# Patient Record
Sex: Female | Born: 1960 | Race: White | Hispanic: No | Marital: Married | Smoking: Never smoker
Health system: Southern US, Community
[De-identification: ages and names within clinical notes are randomized; demographics above are authoritative.]

## PROBLEM LIST (undated history)

## (undated) DIAGNOSIS — K5792 Diverticulitis of intestine, part unspecified, without perforation or abscess without bleeding: Secondary | ICD-10-CM

## (undated) HISTORY — PX: BREAST EXCISIONAL BIOPSY: SUR124

---

## 1999-09-18 ENCOUNTER — Encounter: Payer: Self-pay | Admitting: *Deleted

## 1999-09-18 ENCOUNTER — Encounter: Admission: RE | Admit: 1999-09-18 | Discharge: 1999-09-18 | Payer: Self-pay

## 1999-09-25 ENCOUNTER — Encounter: Payer: Self-pay | Admitting: *Deleted

## 1999-09-25 ENCOUNTER — Encounter: Admission: RE | Admit: 1999-09-25 | Discharge: 1999-09-25 | Payer: Self-pay | Admitting: *Deleted

## 2000-08-07 ENCOUNTER — Encounter: Admission: RE | Admit: 2000-08-07 | Discharge: 2000-11-05 | Payer: Self-pay | Admitting: Internal Medicine

## 2001-07-16 ENCOUNTER — Encounter: Payer: Self-pay | Admitting: Internal Medicine

## 2001-07-16 ENCOUNTER — Encounter: Admission: RE | Admit: 2001-07-16 | Discharge: 2001-07-16 | Payer: Self-pay | Admitting: Internal Medicine

## 2002-06-19 ENCOUNTER — Encounter: Payer: Self-pay | Admitting: Internal Medicine

## 2002-06-19 ENCOUNTER — Encounter: Admission: RE | Admit: 2002-06-19 | Discharge: 2002-06-19 | Payer: Self-pay | Admitting: Internal Medicine

## 2002-07-31 ENCOUNTER — Encounter: Admission: RE | Admit: 2002-07-31 | Discharge: 2002-07-31 | Payer: Self-pay | Admitting: Internal Medicine

## 2002-07-31 ENCOUNTER — Encounter: Payer: Self-pay | Admitting: Internal Medicine

## 2002-08-04 ENCOUNTER — Encounter: Payer: Self-pay | Admitting: Internal Medicine

## 2002-08-04 ENCOUNTER — Encounter: Admission: RE | Admit: 2002-08-04 | Discharge: 2002-08-04 | Payer: Self-pay | Admitting: Internal Medicine

## 2002-08-13 ENCOUNTER — Encounter: Payer: Self-pay | Admitting: Internal Medicine

## 2002-08-13 ENCOUNTER — Encounter: Admission: RE | Admit: 2002-08-13 | Discharge: 2002-08-13 | Payer: Self-pay | Admitting: Internal Medicine

## 2002-09-01 ENCOUNTER — Encounter: Admission: RE | Admit: 2002-09-01 | Discharge: 2002-09-01 | Payer: Self-pay | Admitting: Internal Medicine

## 2002-09-01 ENCOUNTER — Encounter: Payer: Self-pay | Admitting: Internal Medicine

## 2003-02-11 ENCOUNTER — Ambulatory Visit (HOSPITAL_COMMUNITY): Admission: RE | Admit: 2003-02-11 | Discharge: 2003-02-11 | Payer: Self-pay | Admitting: Gastroenterology

## 2003-02-11 ENCOUNTER — Encounter (INDEPENDENT_AMBULATORY_CARE_PROVIDER_SITE_OTHER): Payer: Self-pay

## 2003-05-13 ENCOUNTER — Encounter: Admission: RE | Admit: 2003-05-13 | Discharge: 2003-05-13 | Payer: Self-pay | Admitting: Internal Medicine

## 2004-02-14 ENCOUNTER — Encounter: Admission: RE | Admit: 2004-02-14 | Discharge: 2004-02-14 | Payer: Self-pay | Admitting: Internal Medicine

## 2004-07-18 ENCOUNTER — Encounter: Admission: RE | Admit: 2004-07-18 | Discharge: 2004-07-18 | Payer: Self-pay | Admitting: Internal Medicine

## 2004-08-17 ENCOUNTER — Encounter: Admission: RE | Admit: 2004-08-17 | Discharge: 2004-08-17 | Payer: Self-pay | Admitting: Internal Medicine

## 2004-09-28 ENCOUNTER — Encounter: Admission: RE | Admit: 2004-09-28 | Discharge: 2004-09-28 | Payer: Self-pay | Admitting: Internal Medicine

## 2004-11-18 ENCOUNTER — Emergency Department (HOSPITAL_COMMUNITY): Admission: EM | Admit: 2004-11-18 | Discharge: 2004-11-18 | Payer: Self-pay | Admitting: Emergency Medicine

## 2005-04-04 ENCOUNTER — Encounter: Admission: RE | Admit: 2005-04-04 | Discharge: 2005-04-04 | Payer: Self-pay | Admitting: Internal Medicine

## 2005-05-01 ENCOUNTER — Ambulatory Visit: Payer: Self-pay | Admitting: Gastroenterology

## 2005-06-05 ENCOUNTER — Encounter: Admission: RE | Admit: 2005-06-05 | Discharge: 2005-06-05 | Payer: Self-pay | Admitting: Internal Medicine

## 2005-08-06 ENCOUNTER — Encounter: Admission: RE | Admit: 2005-08-06 | Discharge: 2005-08-06 | Payer: Self-pay | Admitting: Internal Medicine

## 2005-08-16 ENCOUNTER — Encounter: Admission: RE | Admit: 2005-08-16 | Discharge: 2005-08-16 | Payer: Self-pay | Admitting: Internal Medicine

## 2005-12-03 ENCOUNTER — Other Ambulatory Visit: Admission: RE | Admit: 2005-12-03 | Discharge: 2005-12-03 | Payer: Self-pay | Admitting: Internal Medicine

## 2006-09-20 ENCOUNTER — Encounter: Admission: RE | Admit: 2006-09-20 | Discharge: 2006-09-20 | Payer: Self-pay | Admitting: Internal Medicine

## 2007-10-10 ENCOUNTER — Emergency Department (HOSPITAL_COMMUNITY): Admission: EM | Admit: 2007-10-10 | Discharge: 2007-10-10 | Payer: Self-pay | Admitting: Emergency Medicine

## 2007-10-27 ENCOUNTER — Encounter: Admission: RE | Admit: 2007-10-27 | Discharge: 2007-10-27 | Payer: Self-pay | Admitting: Family Medicine

## 2008-10-28 ENCOUNTER — Other Ambulatory Visit: Admission: RE | Admit: 2008-10-28 | Discharge: 2008-10-28 | Payer: Self-pay | Admitting: Internal Medicine

## 2008-10-28 ENCOUNTER — Encounter: Admission: RE | Admit: 2008-10-28 | Discharge: 2008-10-28 | Payer: Self-pay | Admitting: Internal Medicine

## 2009-11-18 ENCOUNTER — Encounter: Admission: RE | Admit: 2009-11-18 | Discharge: 2009-11-18 | Payer: Self-pay | Admitting: Internal Medicine

## 2010-07-23 ENCOUNTER — Encounter: Payer: Self-pay | Admitting: Internal Medicine

## 2010-11-17 NOTE — Op Note (Signed)
   NAME:  Carol Newman, Carol Newman                            ACCOUNT NO.:  [id number]   MEDICAL RECORD NO.:  [id number]                   Newman TYPE:  AMB   LOCATION:  ENDO                                 FACILITY:  [facility]   PHYSICIAN:  Danise Edge, M.D.                DATE OF BIRTH:  11-Mar-1961   DATE OF PROCEDURE:  02/11/2003  DATE OF DISCHARGE:                                 OPERATIVE REPORT   PROCEDURE:  Colonoscopy.   PROCEDURE INDICATIONS:  Carol Newman is a 50 year old female born January 24, 1961.  Carol Newman is undergoing a diagnostic colonoscopy following a bout of  diverticulitis.   ENDOSCOPIST:  Danise Edge, M.D.   PREMEDICATION:  Versed 7.5 mg, Demerol 50 mg.   DESCRIPTION OF PROCEDURE:  After obtaining informed consent, Carol Newman was  placed in Carol left lateral decubitus position.  I administered intravenous  Demerol and intravenous Versed to achieve conscious sedation for Carol  procedure.  Carol Newman's blood pressure, oxygen saturation, and cardiac  rhythm were monitored throughout Carol procedure and documented in Carol medical  record.   Anal inspection was normal.  Digital rectal exam was normal.  Carol Olympus  adjustable pediatric colonoscope was introduced into Carol rectum and easily  advanced to Carol cecum.  Colonic preparation for Carol exam today was  excellent.   Rectum:  From Carol midrectum a 1 mm sessile polyp was removed with Carol cold  biopsy forceps.   Sigmoid colon and descending colon normal.   Splenic flexure normal.   Transverse colon normal.   Hepatic flexure normal.   Ascending colon normal.   Cecum and ileocecal valve normal.    ASSESSMENT:  From Carol midrectum a diminutive polyp was removed with Carol cold  biopsy forceps; otherwise normal proctocolonoscopy to Carol cecum.  No  endoscopic evidence for Carol presence of colonic diverticulosis,  diverticulitis, diverticular stricture formation, colorectal neoplasia, or  inflammatory bowel  disease.                                               Danise Edge, M.D.    MJ/MEDQ  D:  02/11/2003  T:  02/11/2003  Job:  [id number]   cc:   Georgann Housekeeper, M.D.  [address]  [place]  [place] [address]  Fax: [phone]

## 2010-11-28 ENCOUNTER — Ambulatory Visit
Admission: RE | Admit: 2010-11-28 | Discharge: 2010-11-28 | Disposition: A | Discharge status: Home / Self Care | Payer: BC Managed Care – PPO | Source: Ambulatory Visit | Attending: Internal Medicine | Admitting: Internal Medicine

## 2010-11-28 ENCOUNTER — Other Ambulatory Visit: Payer: Self-pay | Admitting: Internal Medicine

## 2010-11-28 DIAGNOSIS — Z1231 Encounter for screening mammogram for malignant neoplasm of breast: Secondary | ICD-10-CM

## 2011-03-27 LAB — URINALYSIS, ROUTINE W REFLEX MICROSCOPIC
Glucose, UA: NEGATIVE
Hgb urine dipstick: NEGATIVE
Specific Gravity, Urine: 1.003 — ABNORMAL LOW
pH: 7

## 2011-03-27 LAB — CBC
HCT: 39.7
Hemoglobin: 13.1
WBC: 7.8

## 2011-03-27 LAB — DIFFERENTIAL
Basophils Absolute: 0
Basophils Relative: 0
Eosinophils Absolute: 0.1
Monocytes Absolute: 0.5
Neutro Abs: 4.7
Neutrophils Relative %: 60

## 2011-03-27 LAB — COMPREHENSIVE METABOLIC PANEL
ALT: 59 — ABNORMAL HIGH
Albumin: 4.1
Alkaline Phosphatase: 61
BUN: 11
Chloride: 108
Glucose, Bld: 108 — ABNORMAL HIGH
Potassium: 4.7
Sodium: 141
Total Bilirubin: 1.3 — ABNORMAL HIGH

## 2011-03-27 LAB — B-NATRIURETIC PEPTIDE (CONVERTED LAB): Pro B Natriuretic peptide (BNP): <30

## 2011-03-27 LAB — D-DIMER, QUANTITATIVE: D-Dimer, Quant: <0.22

## 2011-10-16 ENCOUNTER — Other Ambulatory Visit: Payer: Self-pay | Admitting: Internal Medicine

## 2011-10-16 ENCOUNTER — Other Ambulatory Visit (HOSPITAL_COMMUNITY)
Admission: RE | Admit: 2011-10-16 | Discharge: 2011-10-16 | Disposition: A | Discharge status: Home / Self Care | Payer: BC Managed Care – PPO | Source: Ambulatory Visit | Attending: Internal Medicine | Admitting: Internal Medicine

## 2011-10-16 DIAGNOSIS — Z1159 Encounter for screening for other viral diseases: Secondary | ICD-10-CM | POA: Insufficient documentation

## 2011-10-16 DIAGNOSIS — Z01419 Encounter for gynecological examination (general) (routine) without abnormal findings: Secondary | ICD-10-CM | POA: Insufficient documentation

## 2011-11-07 ENCOUNTER — Ambulatory Visit
Admission: RE | Admit: 2011-11-07 | Discharge: 2011-11-07 | Disposition: A | Discharge status: Home / Self Care | Payer: BC Managed Care – PPO | Source: Ambulatory Visit | Attending: Internal Medicine | Admitting: Internal Medicine

## 2011-11-07 ENCOUNTER — Other Ambulatory Visit: Payer: Self-pay | Admitting: Internal Medicine

## 2011-11-07 DIAGNOSIS — Z1231 Encounter for screening mammogram for malignant neoplasm of breast: Secondary | ICD-10-CM

## 2011-11-20 ENCOUNTER — Other Ambulatory Visit: Payer: Self-pay | Admitting: *Deleted

## 2011-11-21 ENCOUNTER — Other Ambulatory Visit: Payer: BC Managed Care – PPO

## 2011-11-21 ENCOUNTER — Ambulatory Visit
Admission: RE | Admit: 2011-11-21 | Discharge: 2011-11-21 | Disposition: A | Discharge status: Home / Self Care | Payer: BC Managed Care – PPO | Source: Ambulatory Visit | Attending: *Deleted | Admitting: *Deleted

## 2012-01-08 ENCOUNTER — Other Ambulatory Visit: Payer: Self-pay | Admitting: *Deleted

## 2012-01-08 DIAGNOSIS — M542 Cervicalgia: Secondary | ICD-10-CM

## 2012-01-11 ENCOUNTER — Other Ambulatory Visit: Payer: BC Managed Care – PPO

## 2012-01-15 ENCOUNTER — Ambulatory Visit
Admission: RE | Admit: 2012-01-15 | Discharge: 2012-01-15 | Disposition: A | Discharge status: Home / Self Care | Payer: BC Managed Care – PPO | Source: Ambulatory Visit | Attending: *Deleted | Admitting: *Deleted

## 2012-01-15 DIAGNOSIS — M542 Cervicalgia: Secondary | ICD-10-CM

## 2012-02-13 ENCOUNTER — Other Ambulatory Visit: Payer: Self-pay | Admitting: Neurosurgery

## 2012-02-13 DIAGNOSIS — M542 Cervicalgia: Secondary | ICD-10-CM

## 2012-02-19 ENCOUNTER — Ambulatory Visit
Admission: RE | Admit: 2012-02-19 | Discharge: 2012-02-19 | Disposition: A | Discharge status: Home / Self Care | Payer: BC Managed Care – PPO | Source: Ambulatory Visit | Attending: Neurosurgery | Admitting: Neurosurgery

## 2012-02-19 VITALS — BP 107/73 | HR 70 | Wt 186.0 lb

## 2012-02-19 DIAGNOSIS — M542 Cervicalgia: Secondary | ICD-10-CM

## 2012-02-19 MED ORDER — DIAZEPAM 5 MG PO TABS
10.0000 mg | ORAL_TABLET | Freq: Once | ORAL | Status: AC
  Administered 2012-02-19: 10 mg via ORAL

## 2012-02-19 MED ORDER — ONDANSETRON HCL 4 MG/2ML IJ SOLN
4.0000 mg | Freq: Once | INTRAMUSCULAR | Status: AC
  Administered 2012-02-19: 4 mg via INTRAMUSCULAR

## 2012-02-19 MED ORDER — HYDROMORPHONE HCL PF 2 MG/ML IJ SOLN
2.0000 mg | Freq: Once | INTRAMUSCULAR | Status: AC
  Administered 2012-02-19: 2 mg via INTRAMUSCULAR

## 2012-02-19 MED ORDER — IOHEXOL 300 MG/ML  SOLN
10.0000 mL | Freq: Once | INTRAMUSCULAR | Status: AC | PRN
  Administered 2012-02-19: 10 mL via INTRATHECAL

## 2012-11-04 ENCOUNTER — Other Ambulatory Visit: Payer: Self-pay

## 2012-11-04 DIAGNOSIS — Z1231 Encounter for screening mammogram for malignant neoplasm of breast: Secondary | ICD-10-CM

## 2012-12-10 ENCOUNTER — Ambulatory Visit
Admission: RE | Admit: 2012-12-10 | Discharge: 2012-12-10 | Disposition: A | Discharge status: Home / Self Care | Payer: BC Managed Care – PPO | Source: Ambulatory Visit

## 2012-12-10 DIAGNOSIS — Z1231 Encounter for screening mammogram for malignant neoplasm of breast: Secondary | ICD-10-CM

## 2013-08-05 ENCOUNTER — Ambulatory Visit
Admission: RE | Admit: 2013-08-05 | Discharge: 2013-08-05 | Disposition: A | Discharge status: Home / Self Care | Payer: BC Managed Care – PPO | Source: Ambulatory Visit | Attending: Internal Medicine | Admitting: Internal Medicine

## 2013-08-05 ENCOUNTER — Other Ambulatory Visit: Payer: Self-pay | Admitting: Internal Medicine

## 2013-08-05 DIAGNOSIS — M545 Low back pain, unspecified: Secondary | ICD-10-CM

## 2013-12-29 ENCOUNTER — Other Ambulatory Visit: Payer: Self-pay

## 2013-12-29 DIAGNOSIS — Z9889 Other specified postprocedural states: Secondary | ICD-10-CM

## 2013-12-29 DIAGNOSIS — Z1231 Encounter for screening mammogram for malignant neoplasm of breast: Secondary | ICD-10-CM

## 2013-12-30 ENCOUNTER — Ambulatory Visit
Admission: RE | Admit: 2013-12-30 | Discharge: 2013-12-30 | Disposition: A | Discharge status: Home / Self Care | Payer: BC Managed Care – PPO | Source: Ambulatory Visit

## 2013-12-30 ENCOUNTER — Other Ambulatory Visit: Payer: Self-pay

## 2013-12-30 ENCOUNTER — Ambulatory Visit: Payer: BC Managed Care – PPO

## 2013-12-30 ENCOUNTER — Encounter (INDEPENDENT_AMBULATORY_CARE_PROVIDER_SITE_OTHER): Payer: Self-pay

## 2013-12-30 DIAGNOSIS — Z9889 Other specified postprocedural states: Secondary | ICD-10-CM

## 2013-12-30 DIAGNOSIS — Z1231 Encounter for screening mammogram for malignant neoplasm of breast: Secondary | ICD-10-CM

## 2013-12-31 ENCOUNTER — Other Ambulatory Visit: Payer: Self-pay | Admitting: Internal Medicine

## 2013-12-31 DIAGNOSIS — R928 Other abnormal and inconclusive findings on diagnostic imaging of breast: Secondary | ICD-10-CM

## 2014-01-12 ENCOUNTER — Ambulatory Visit
Admission: RE | Admit: 2014-01-12 | Discharge: 2014-01-12 | Disposition: A | Discharge status: Home / Self Care | Payer: BC Managed Care – PPO | Source: Ambulatory Visit | Attending: Internal Medicine | Admitting: Internal Medicine

## 2014-01-12 DIAGNOSIS — R928 Other abnormal and inconclusive findings on diagnostic imaging of breast: Secondary | ICD-10-CM

## 2015-02-14 ENCOUNTER — Other Ambulatory Visit: Payer: Self-pay

## 2015-02-14 DIAGNOSIS — Z1231 Encounter for screening mammogram for malignant neoplasm of breast: Secondary | ICD-10-CM

## 2015-02-23 ENCOUNTER — Ambulatory Visit
Admission: RE | Admit: 2015-02-23 | Discharge: 2015-02-23 | Disposition: A | Discharge status: Home / Self Care | Payer: BLUE CROSS/BLUE SHIELD | Source: Ambulatory Visit

## 2015-02-23 DIAGNOSIS — Z1231 Encounter for screening mammogram for malignant neoplasm of breast: Secondary | ICD-10-CM

## 2015-02-24 ENCOUNTER — Other Ambulatory Visit: Payer: Self-pay | Admitting: Internal Medicine

## 2015-02-24 DIAGNOSIS — R928 Other abnormal and inconclusive findings on diagnostic imaging of breast: Secondary | ICD-10-CM

## 2015-03-17 ENCOUNTER — Ambulatory Visit
Admission: RE | Admit: 2015-03-17 | Discharge: 2015-03-17 | Disposition: A | Discharge status: Home / Self Care | Payer: BLUE CROSS/BLUE SHIELD | Source: Ambulatory Visit | Attending: Internal Medicine | Admitting: Internal Medicine

## 2015-03-17 DIAGNOSIS — R928 Other abnormal and inconclusive findings on diagnostic imaging of breast: Secondary | ICD-10-CM

## 2015-03-29 ENCOUNTER — Other Ambulatory Visit: Payer: Self-pay | Admitting: Obstetrics & Gynecology

## 2015-05-25 ENCOUNTER — Other Ambulatory Visit: Payer: Self-pay | Admitting: Internal Medicine

## 2015-05-25 ENCOUNTER — Ambulatory Visit
Admission: RE | Admit: 2015-05-25 | Discharge: 2015-05-25 | Disposition: A | Discharge status: Home / Self Care | Payer: BLUE CROSS/BLUE SHIELD | Source: Ambulatory Visit | Attending: Internal Medicine | Admitting: Internal Medicine

## 2015-05-25 DIAGNOSIS — Z87891 Personal history of nicotine dependence: Secondary | ICD-10-CM

## 2015-07-06 ENCOUNTER — Other Ambulatory Visit: Payer: Self-pay | Admitting: Internal Medicine

## 2015-07-06 DIAGNOSIS — R921 Mammographic calcification found on diagnostic imaging of breast: Secondary | ICD-10-CM

## 2015-09-21 ENCOUNTER — Other Ambulatory Visit: Payer: Self-pay | Admitting: Internal Medicine

## 2015-09-21 ENCOUNTER — Ambulatory Visit
Admission: RE | Admit: 2015-09-21 | Discharge: 2015-09-21 | Disposition: A | Discharge status: Home / Self Care | Payer: BLUE CROSS/BLUE SHIELD | Source: Ambulatory Visit | Attending: Internal Medicine | Admitting: Internal Medicine

## 2015-09-21 DIAGNOSIS — R921 Mammographic calcification found on diagnostic imaging of breast: Secondary | ICD-10-CM

## 2015-11-17 DIAGNOSIS — M545 Low back pain: Secondary | ICD-10-CM | POA: Diagnosis not present

## 2015-11-17 DIAGNOSIS — M797 Fibromyalgia: Secondary | ICD-10-CM | POA: Diagnosis not present

## 2015-11-17 DIAGNOSIS — N9089 Other specified noninflammatory disorders of vulva and perineum: Secondary | ICD-10-CM | POA: Diagnosis not present

## 2015-11-17 DIAGNOSIS — N949 Unspecified condition associated with female genital organs and menstrual cycle: Secondary | ICD-10-CM | POA: Diagnosis not present

## 2015-12-15 DIAGNOSIS — J019 Acute sinusitis, unspecified: Secondary | ICD-10-CM | POA: Diagnosis not present

## 2015-12-15 DIAGNOSIS — M797 Fibromyalgia: Secondary | ICD-10-CM | POA: Diagnosis not present

## 2015-12-15 DIAGNOSIS — M25659 Stiffness of unspecified hip, not elsewhere classified: Secondary | ICD-10-CM | POA: Diagnosis not present

## 2016-02-15 DIAGNOSIS — E559 Vitamin D deficiency, unspecified: Secondary | ICD-10-CM | POA: Diagnosis not present

## 2016-02-15 DIAGNOSIS — Z79899 Other long term (current) drug therapy: Secondary | ICD-10-CM | POA: Diagnosis not present

## 2016-02-15 DIAGNOSIS — M545 Low back pain: Secondary | ICD-10-CM | POA: Diagnosis not present

## 2016-02-15 DIAGNOSIS — E7800 Pure hypercholesterolemia, unspecified: Secondary | ICD-10-CM | POA: Diagnosis not present

## 2016-02-15 DIAGNOSIS — R1032 Left lower quadrant pain: Secondary | ICD-10-CM | POA: Diagnosis not present

## 2016-02-15 DIAGNOSIS — M797 Fibromyalgia: Secondary | ICD-10-CM | POA: Diagnosis not present

## 2016-02-15 DIAGNOSIS — N949 Unspecified condition associated with female genital organs and menstrual cycle: Secondary | ICD-10-CM | POA: Diagnosis not present

## 2016-02-22 DIAGNOSIS — R1032 Left lower quadrant pain: Secondary | ICD-10-CM | POA: Diagnosis not present

## 2016-03-12 ENCOUNTER — Other Ambulatory Visit: Payer: Self-pay | Admitting: Internal Medicine

## 2016-03-12 DIAGNOSIS — N63 Unspecified lump in unspecified breast: Secondary | ICD-10-CM

## 2016-03-12 DIAGNOSIS — R921 Mammographic calcification found on diagnostic imaging of breast: Secondary | ICD-10-CM

## 2016-03-16 ENCOUNTER — Ambulatory Visit
Admission: RE | Admit: 2016-03-16 | Discharge: 2016-03-16 | Disposition: A | Discharge status: Home / Self Care | Payer: BLUE CROSS/BLUE SHIELD | Source: Ambulatory Visit | Attending: Internal Medicine | Admitting: Internal Medicine

## 2016-03-16 DIAGNOSIS — R921 Mammographic calcification found on diagnostic imaging of breast: Secondary | ICD-10-CM | POA: Diagnosis not present

## 2016-03-19 DIAGNOSIS — L820 Inflamed seborrheic keratosis: Secondary | ICD-10-CM | POA: Diagnosis not present

## 2016-03-19 DIAGNOSIS — D224 Melanocytic nevi of scalp and neck: Secondary | ICD-10-CM | POA: Diagnosis not present

## 2016-03-19 DIAGNOSIS — D485 Neoplasm of uncertain behavior of skin: Secondary | ICD-10-CM | POA: Diagnosis not present

## 2016-03-24 DIAGNOSIS — Z23 Encounter for immunization: Secondary | ICD-10-CM | POA: Diagnosis not present

## 2016-04-02 DIAGNOSIS — R197 Diarrhea, unspecified: Secondary | ICD-10-CM | POA: Diagnosis not present

## 2016-04-02 DIAGNOSIS — R1032 Left lower quadrant pain: Secondary | ICD-10-CM | POA: Diagnosis not present

## 2016-05-09 ENCOUNTER — Other Ambulatory Visit: Payer: Self-pay | Admitting: Internal Medicine

## 2016-05-09 DIAGNOSIS — E559 Vitamin D deficiency, unspecified: Secondary | ICD-10-CM | POA: Diagnosis not present

## 2016-05-09 DIAGNOSIS — M545 Low back pain: Secondary | ICD-10-CM | POA: Diagnosis not present

## 2016-05-09 DIAGNOSIS — Z0001 Encounter for general adult medical examination with abnormal findings: Secondary | ICD-10-CM | POA: Diagnosis not present

## 2016-05-09 DIAGNOSIS — E7800 Pure hypercholesterolemia, unspecified: Secondary | ICD-10-CM | POA: Diagnosis not present

## 2016-05-09 DIAGNOSIS — R1032 Left lower quadrant pain: Secondary | ICD-10-CM

## 2016-05-09 DIAGNOSIS — R5382 Chronic fatigue, unspecified: Secondary | ICD-10-CM | POA: Diagnosis not present

## 2016-05-15 ENCOUNTER — Ambulatory Visit
Admission: RE | Admit: 2016-05-15 | Discharge: 2016-05-15 | Disposition: A | Discharge status: Home / Self Care | Payer: BLUE CROSS/BLUE SHIELD | Source: Ambulatory Visit | Attending: Internal Medicine | Admitting: Internal Medicine

## 2016-05-15 DIAGNOSIS — R1032 Left lower quadrant pain: Secondary | ICD-10-CM

## 2016-05-15 MED ORDER — IOPAMIDOL (ISOVUE-300) INJECTION 61%
100.0000 mL | Freq: Once | INTRAVENOUS | Status: AC | PRN
  Administered 2016-05-15: 100 mL via INTRAVENOUS

## 2016-06-20 DIAGNOSIS — M85852 Other specified disorders of bone density and structure, left thigh: Secondary | ICD-10-CM | POA: Diagnosis not present

## 2016-06-20 DIAGNOSIS — M8588 Other specified disorders of bone density and structure, other site: Secondary | ICD-10-CM | POA: Diagnosis not present

## 2016-06-20 DIAGNOSIS — M85862 Other specified disorders of bone density and structure, left lower leg: Secondary | ICD-10-CM | POA: Diagnosis not present

## 2016-08-28 DIAGNOSIS — D3132 Benign neoplasm of left choroid: Secondary | ICD-10-CM | POA: Diagnosis not present

## 2016-08-28 DIAGNOSIS — M549 Dorsalgia, unspecified: Secondary | ICD-10-CM | POA: Diagnosis not present

## 2016-10-22 DIAGNOSIS — E538 Deficiency of other specified B group vitamins: Secondary | ICD-10-CM | POA: Diagnosis not present

## 2016-10-22 DIAGNOSIS — E7800 Pure hypercholesterolemia, unspecified: Secondary | ICD-10-CM | POA: Diagnosis not present

## 2016-11-06 DIAGNOSIS — A059 Bacterial foodborne intoxication, unspecified: Secondary | ICD-10-CM | POA: Diagnosis not present

## 2016-11-06 DIAGNOSIS — M545 Low back pain: Secondary | ICD-10-CM | POA: Diagnosis not present

## 2016-12-21 DIAGNOSIS — M545 Low back pain: Secondary | ICD-10-CM | POA: Diagnosis not present

## 2016-12-21 DIAGNOSIS — E7800 Pure hypercholesterolemia, unspecified: Secondary | ICD-10-CM | POA: Diagnosis not present

## 2016-12-21 DIAGNOSIS — E538 Deficiency of other specified B group vitamins: Secondary | ICD-10-CM | POA: Diagnosis not present

## 2017-01-21 DIAGNOSIS — J309 Allergic rhinitis, unspecified: Secondary | ICD-10-CM | POA: Diagnosis not present

## 2017-01-21 DIAGNOSIS — H1033 Unspecified acute conjunctivitis, bilateral: Secondary | ICD-10-CM | POA: Diagnosis not present

## 2017-02-05 ENCOUNTER — Other Ambulatory Visit: Payer: Self-pay | Admitting: Internal Medicine

## 2017-02-05 DIAGNOSIS — R921 Mammographic calcification found on diagnostic imaging of breast: Secondary | ICD-10-CM

## 2017-02-21 DIAGNOSIS — K529 Noninfective gastroenteritis and colitis, unspecified: Secondary | ICD-10-CM | POA: Diagnosis not present

## 2017-02-21 DIAGNOSIS — J029 Acute pharyngitis, unspecified: Secondary | ICD-10-CM | POA: Diagnosis not present

## 2017-02-21 DIAGNOSIS — M25551 Pain in right hip: Secondary | ICD-10-CM | POA: Diagnosis not present

## 2017-03-20 ENCOUNTER — Ambulatory Visit
Admission: RE | Admit: 2017-03-20 | Discharge: 2017-03-20 | Disposition: A | Discharge status: Home / Self Care | Payer: BLUE CROSS/BLUE SHIELD | Source: Ambulatory Visit | Attending: Internal Medicine | Admitting: Internal Medicine

## 2017-03-20 DIAGNOSIS — R921 Mammographic calcification found on diagnostic imaging of breast: Secondary | ICD-10-CM

## 2017-03-20 DIAGNOSIS — R928 Other abnormal and inconclusive findings on diagnostic imaging of breast: Secondary | ICD-10-CM | POA: Diagnosis not present

## 2017-04-02 ENCOUNTER — Other Ambulatory Visit: Payer: Self-pay | Admitting: Nurse Practitioner

## 2017-04-02 ENCOUNTER — Ambulatory Visit
Admission: RE | Admit: 2017-04-02 | Discharge: 2017-04-02 | Disposition: A | Discharge status: Home / Self Care | Payer: BLUE CROSS/BLUE SHIELD | Source: Ambulatory Visit | Attending: Nurse Practitioner | Admitting: Nurse Practitioner

## 2017-04-02 DIAGNOSIS — S93491A Sprain of other ligament of right ankle, initial encounter: Secondary | ICD-10-CM

## 2017-04-02 DIAGNOSIS — S99911A Unspecified injury of right ankle, initial encounter: Secondary | ICD-10-CM | POA: Diagnosis not present

## 2017-04-02 DIAGNOSIS — S93492A Sprain of other ligament of left ankle, initial encounter: Secondary | ICD-10-CM

## 2017-04-02 DIAGNOSIS — M7989 Other specified soft tissue disorders: Secondary | ICD-10-CM | POA: Diagnosis not present

## 2017-04-02 DIAGNOSIS — M25571 Pain in right ankle and joints of right foot: Secondary | ICD-10-CM | POA: Diagnosis not present

## 2017-05-22 DIAGNOSIS — Z79899 Other long term (current) drug therapy: Secondary | ICD-10-CM | POA: Diagnosis not present

## 2017-05-22 DIAGNOSIS — G43009 Migraine without aura, not intractable, without status migrainosus: Secondary | ICD-10-CM | POA: Diagnosis not present

## 2017-05-22 DIAGNOSIS — E559 Vitamin D deficiency, unspecified: Secondary | ICD-10-CM | POA: Diagnosis not present

## 2017-05-22 DIAGNOSIS — E538 Deficiency of other specified B group vitamins: Secondary | ICD-10-CM | POA: Diagnosis not present

## 2017-05-22 DIAGNOSIS — Z0000 Encounter for general adult medical examination without abnormal findings: Secondary | ICD-10-CM | POA: Diagnosis not present

## 2017-05-22 DIAGNOSIS — E7800 Pure hypercholesterolemia, unspecified: Secondary | ICD-10-CM | POA: Diagnosis not present

## 2017-06-20 DIAGNOSIS — D485 Neoplasm of uncertain behavior of skin: Secondary | ICD-10-CM | POA: Diagnosis not present

## 2017-06-20 DIAGNOSIS — L570 Actinic keratosis: Secondary | ICD-10-CM | POA: Diagnosis not present

## 2017-06-20 DIAGNOSIS — L309 Dermatitis, unspecified: Secondary | ICD-10-CM | POA: Diagnosis not present

## 2017-08-12 DIAGNOSIS — R32 Unspecified urinary incontinence: Secondary | ICD-10-CM | POA: Diagnosis not present

## 2017-08-12 DIAGNOSIS — R05 Cough: Secondary | ICD-10-CM | POA: Diagnosis not present

## 2017-08-12 DIAGNOSIS — J209 Acute bronchitis, unspecified: Secondary | ICD-10-CM | POA: Diagnosis not present

## 2017-08-29 DIAGNOSIS — R05 Cough: Secondary | ICD-10-CM | POA: Diagnosis not present

## 2017-08-29 DIAGNOSIS — M545 Low back pain: Secondary | ICD-10-CM | POA: Diagnosis not present

## 2017-08-29 DIAGNOSIS — M797 Fibromyalgia: Secondary | ICD-10-CM | POA: Diagnosis not present

## 2017-09-11 DIAGNOSIS — M549 Dorsalgia, unspecified: Secondary | ICD-10-CM | POA: Diagnosis not present

## 2017-09-11 DIAGNOSIS — J309 Allergic rhinitis, unspecified: Secondary | ICD-10-CM | POA: Diagnosis not present

## 2017-09-11 DIAGNOSIS — J329 Chronic sinusitis, unspecified: Secondary | ICD-10-CM | POA: Diagnosis not present

## 2017-09-11 DIAGNOSIS — E7800 Pure hypercholesterolemia, unspecified: Secondary | ICD-10-CM | POA: Diagnosis not present

## 2017-10-15 DIAGNOSIS — L821 Other seborrheic keratosis: Secondary | ICD-10-CM | POA: Diagnosis not present

## 2017-10-15 DIAGNOSIS — D225 Melanocytic nevi of trunk: Secondary | ICD-10-CM | POA: Diagnosis not present

## 2017-10-15 DIAGNOSIS — L814 Other melanin hyperpigmentation: Secondary | ICD-10-CM | POA: Diagnosis not present

## 2017-10-15 DIAGNOSIS — D1801 Hemangioma of skin and subcutaneous tissue: Secondary | ICD-10-CM | POA: Diagnosis not present

## 2017-11-08 DIAGNOSIS — N390 Urinary tract infection, site not specified: Secondary | ICD-10-CM | POA: Diagnosis not present

## 2017-11-20 DIAGNOSIS — M545 Low back pain: Secondary | ICD-10-CM | POA: Diagnosis not present

## 2017-11-20 DIAGNOSIS — M797 Fibromyalgia: Secondary | ICD-10-CM | POA: Diagnosis not present

## 2017-12-01 DIAGNOSIS — M26601 Right temporomandibular joint disorder, unspecified: Secondary | ICD-10-CM | POA: Diagnosis not present

## 2017-12-04 DIAGNOSIS — J310 Chronic rhinitis: Secondary | ICD-10-CM | POA: Diagnosis not present

## 2017-12-04 DIAGNOSIS — H6123 Impacted cerumen, bilateral: Secondary | ICD-10-CM | POA: Diagnosis not present

## 2018-01-14 DIAGNOSIS — D485 Neoplasm of uncertain behavior of skin: Secondary | ICD-10-CM | POA: Diagnosis not present

## 2018-01-14 DIAGNOSIS — L570 Actinic keratosis: Secondary | ICD-10-CM | POA: Diagnosis not present

## 2018-02-19 DIAGNOSIS — M7070 Other bursitis of hip, unspecified hip: Secondary | ICD-10-CM | POA: Diagnosis not present

## 2018-02-24 DIAGNOSIS — M545 Low back pain: Secondary | ICD-10-CM | POA: Diagnosis not present

## 2018-02-24 DIAGNOSIS — M7070 Other bursitis of hip, unspecified hip: Secondary | ICD-10-CM | POA: Diagnosis not present

## 2018-02-25 ENCOUNTER — Other Ambulatory Visit: Payer: Self-pay | Admitting: Internal Medicine

## 2018-02-25 DIAGNOSIS — Z1231 Encounter for screening mammogram for malignant neoplasm of breast: Secondary | ICD-10-CM

## 2018-03-20 DIAGNOSIS — K602 Anal fissure, unspecified: Secondary | ICD-10-CM | POA: Diagnosis not present

## 2018-03-20 DIAGNOSIS — K6289 Other specified diseases of anus and rectum: Secondary | ICD-10-CM | POA: Diagnosis not present

## 2018-03-21 ENCOUNTER — Ambulatory Visit
Admission: RE | Admit: 2018-03-21 | Discharge: 2018-03-21 | Disposition: A | Discharge status: Home / Self Care | Payer: BLUE CROSS/BLUE SHIELD | Source: Ambulatory Visit | Attending: Internal Medicine | Admitting: Internal Medicine

## 2018-03-21 DIAGNOSIS — Z1231 Encounter for screening mammogram for malignant neoplasm of breast: Secondary | ICD-10-CM

## 2018-06-19 DIAGNOSIS — M545 Low back pain: Secondary | ICD-10-CM | POA: Diagnosis not present

## 2018-09-09 DIAGNOSIS — R21 Rash and other nonspecific skin eruption: Secondary | ICD-10-CM | POA: Diagnosis not present

## 2018-09-22 DIAGNOSIS — N61 Mastitis without abscess: Secondary | ICD-10-CM | POA: Diagnosis not present

## 2018-10-21 DIAGNOSIS — M25659 Stiffness of unspecified hip, not elsewhere classified: Secondary | ICD-10-CM | POA: Diagnosis not present

## 2018-10-21 DIAGNOSIS — M545 Low back pain: Secondary | ICD-10-CM | POA: Diagnosis not present

## 2018-12-18 DIAGNOSIS — M797 Fibromyalgia: Secondary | ICD-10-CM | POA: Diagnosis not present

## 2018-12-18 DIAGNOSIS — J3489 Other specified disorders of nose and nasal sinuses: Secondary | ICD-10-CM | POA: Diagnosis not present

## 2018-12-18 DIAGNOSIS — R509 Fever, unspecified: Secondary | ICD-10-CM | POA: Diagnosis not present

## 2018-12-19 DIAGNOSIS — R509 Fever, unspecified: Secondary | ICD-10-CM | POA: Diagnosis not present

## 2019-01-13 DIAGNOSIS — L570 Actinic keratosis: Secondary | ICD-10-CM | POA: Diagnosis not present

## 2019-01-13 DIAGNOSIS — L821 Other seborrheic keratosis: Secondary | ICD-10-CM | POA: Diagnosis not present

## 2019-01-13 DIAGNOSIS — L814 Other melanin hyperpigmentation: Secondary | ICD-10-CM | POA: Diagnosis not present

## 2019-01-13 DIAGNOSIS — D225 Melanocytic nevi of trunk: Secondary | ICD-10-CM | POA: Diagnosis not present

## 2019-01-13 DIAGNOSIS — Z85828 Personal history of other malignant neoplasm of skin: Secondary | ICD-10-CM | POA: Diagnosis not present

## 2019-02-03 ENCOUNTER — Emergency Department (HOSPITAL_COMMUNITY): Payer: BC Managed Care – PPO

## 2019-02-03 ENCOUNTER — Emergency Department (HOSPITAL_COMMUNITY)
Admission: EM | Admit: 2019-02-03 | Discharge: 2019-02-03 | Disposition: A | Discharge status: Home / Self Care | Payer: BC Managed Care – PPO | Attending: Emergency Medicine | Admitting: Emergency Medicine

## 2019-02-03 ENCOUNTER — Other Ambulatory Visit: Payer: Self-pay

## 2019-02-03 ENCOUNTER — Encounter (HOSPITAL_COMMUNITY): Payer: Self-pay

## 2019-02-03 DIAGNOSIS — Z880 Allergy status to penicillin: Secondary | ICD-10-CM | POA: Diagnosis not present

## 2019-02-03 DIAGNOSIS — R1032 Left lower quadrant pain: Secondary | ICD-10-CM | POA: Diagnosis not present

## 2019-02-03 DIAGNOSIS — K6389 Other specified diseases of intestine: Secondary | ICD-10-CM

## 2019-02-03 DIAGNOSIS — Q438 Other specified congenital malformations of intestine: Secondary | ICD-10-CM | POA: Diagnosis not present

## 2019-02-03 DIAGNOSIS — R109 Unspecified abdominal pain: Secondary | ICD-10-CM | POA: Diagnosis not present

## 2019-02-03 DIAGNOSIS — K659 Peritonitis, unspecified: Secondary | ICD-10-CM | POA: Diagnosis not present

## 2019-02-03 HISTORY — DX: Diverticulitis of intestine, part unspecified, without perforation or abscess without bleeding: K57.92

## 2019-02-03 LAB — CBC WITH DIFFERENTIAL/PLATELET
Abs Immature Granulocytes: 0.02 10*3/uL (ref 0.00–0.07)
Basophils Absolute: 0 10*3/uL (ref 0.0–0.1)
Basophils Relative: 0 %
Eosinophils Absolute: 0.1 10*3/uL (ref 0.0–0.5)
Eosinophils Relative: 1 %
HCT: 42.7 % (ref 36.0–46.0)
Hemoglobin: 13.6 g/dL (ref 12.0–15.0)
Immature Granulocytes: 0 %
Lymphocytes Relative: 27 %
Lymphs Abs: 2 10*3/uL (ref 0.7–4.0)
MCH: 30 pg (ref 26.0–34.0)
MCHC: 31.9 g/dL (ref 30.0–36.0)
MCV: 94.1 fL (ref 80.0–100.0)
Monocytes Absolute: 0.7 10*3/uL (ref 0.1–1.0)
Monocytes Relative: 10 %
Neutro Abs: 4.4 10*3/uL (ref 1.7–7.7)
Neutrophils Relative %: 62 %
Platelets: 289 10*3/uL (ref 150–400)
RBC: 4.54 MIL/uL (ref 3.87–5.11)
RDW: 13.9 % (ref 11.5–15.5)
WBC: 7.2 10*3/uL (ref 4.0–10.5)
nRBC: 0 % (ref 0.0–0.2)

## 2019-02-03 LAB — URINALYSIS, ROUTINE W REFLEX MICROSCOPIC
Bilirubin Urine: NEGATIVE
Glucose, UA: NEGATIVE mg/dL
Ketones, ur: NEGATIVE mg/dL
Leukocytes,Ua: NEGATIVE
Nitrite: NEGATIVE
Protein, ur: NEGATIVE mg/dL
Specific Gravity, Urine: 1.003 — ABNORMAL LOW (ref 1.005–1.030)
pH: 5 (ref 5.0–8.0)

## 2019-02-03 LAB — BASIC METABOLIC PANEL
Anion gap: 7 (ref 5–15)
BUN: 12 mg/dL (ref 6–20)
CO2: 23 mmol/L (ref 22–32)
Calcium: 8.8 mg/dL — ABNORMAL LOW (ref 8.9–10.3)
Chloride: 106 mmol/L (ref 98–111)
Creatinine, Ser: 0.86 mg/dL (ref 0.44–1.00)
GFR calc Af Amer: >60 mL/min (ref >60)
GFR calc non Af Amer: >60 mL/min (ref >60)
Glucose, Bld: 111 mg/dL — ABNORMAL HIGH (ref 70–99)
Potassium: 3.7 mmol/L (ref 3.5–5.1)
Sodium: 136 mmol/L (ref 135–145)

## 2019-02-03 MED ORDER — IOHEXOL 300 MG/ML  SOLN
100.0000 mL | Freq: Once | INTRAMUSCULAR | Status: AC | PRN
  Administered 2019-02-03: 04:00:00 100 mL via INTRAVENOUS

## 2019-02-03 MED ORDER — MORPHINE SULFATE (PF) 4 MG/ML IV SOLN: 4.0000 mg | Freq: Once | INTRAVENOUS | Status: DC

## 2019-02-03 MED ORDER — SODIUM CHLORIDE (PF) 0.9 % IJ SOLN
INTRAMUSCULAR | Status: AC
  Filled 2019-02-03: qty 50

## 2019-02-03 MED ORDER — FAMOTIDINE IN NACL 20-0.9 MG/50ML-% IV SOLN
20.0000 mg | Freq: Once | INTRAVENOUS | Status: AC
  Administered 2019-02-03: 05:00:00 20 mg via INTRAVENOUS
  Filled 2019-02-03: qty 50

## 2019-02-03 MED ORDER — DIPHENHYDRAMINE HCL 50 MG/ML IJ SOLN
25.0000 mg | Freq: Once | INTRAMUSCULAR | Status: AC
  Administered 2019-02-03: 05:00:00 25 mg via INTRAVENOUS
  Filled 2019-02-03: qty 1

## 2019-02-03 MED ORDER — SODIUM CHLORIDE 0.9 % IV BOLUS
1000.0000 mL | Freq: Once | INTRAVENOUS | Status: AC
  Administered 2019-02-03: 1000 mL via INTRAVENOUS

## 2019-02-03 NOTE — ED Triage Notes (Signed)
Pt complains of left lower abdominal pain severely about one hour ago, she has diverticulitis and had her meds refilled but tonight the pain became severe even after 2 hydrocodones

## 2019-02-03 NOTE — ED Notes (Signed)
Pt given Benadryl and Pepcid for reaction to contrast dye with hives and chest tightness. Pt currently not wanting Morphine until Benadryl and Pepcid have had time to work. Pt given call [place] will continue to monitor.

## 2019-02-03 NOTE — Discharge Instructions (Signed)
Continue your antibiotic and pain medication as previously prescribed by your primary doctor.  Return to the emergency department if you develop worsening pain, high fever, bloody stools, or other new and concerning symptoms.

## 2019-02-03 NOTE — ED Notes (Signed)
Patient transported to CT 

## 2019-02-03 NOTE — ED Provider Notes (Signed)
[facility]-EMERGENCY DEPT Provider Note   CSN: [id number] Arrival date & time: 02/03/19  0049     History   Chief Complaint Chief Complaint  Patient presents with  . Abdominal Pain    HPI Carol Newman is a 58 y.o. female.     Patient is a 58 year old female with past medical history of diverticulitis and fibromyalgia.  She presents today for evaluation of left lower quadrant pain.  This has been present for the past 2 days.  She was started on an antibiotic by her primary doctor as well as a pain medicine, however tonight the pain became worse.  She describes her abdomen feeling more distended and rigid.  She also feels as though something "ruptured" in her abdomen.  She denies any fevers or chills.  She denies any bloody stools.  She denies any urinary complaints.  The history is provided by the patient.  Abdominal Pain Pain location:  LLQ Pain quality: cramping   Pain radiates to:  Does not radiate Pain severity:  Moderate Onset quality:  Gradual Duration:  2 days Timing:  Constant Progression:  Worsening Chronicity:  Recurrent Relieved by:  Nothing Worsened by:  Movement and palpation   Past Medical History:  Diagnosis Date  . Diverticulitis     There are no active problems to display for this patient.   History reviewed. No pertinent surgical history.   OB History   No obstetric history on file.      Home Medications    Prior to Admission medications   Not on File    Family History History reviewed. No pertinent family history.  Social History Social History   Tobacco Use  . Smoking status: Never Smoker  . Smokeless tobacco: Never Used  Substance Use Topics  . Alcohol use: Never    Frequency: Never  . Drug use: Never     Allergies   Xylocaine-epinephrine [lidocaine-epinephrine] and Penicillins   Review of Systems Review of Systems  Gastrointestinal: Positive for abdominal pain.  All other systems reviewed and  are negative.    Physical Exam Updated Vital Signs BP (!) 143/84 (BP Location: Left Arm)   Pulse 73   Temp 98 F (36.7 C) (Oral)   Resp 15   Ht 5' 8.5" (1.74 m)   Wt 86.2 kg   SpO2 98%   BMI 28.47 kg/m   Physical Exam Vitals signs and nursing note reviewed.  Constitutional:      General: She is not in acute distress.    Appearance: She is well-developed. She is not diaphoretic.  HENT:     Head: Normocephalic and atraumatic.  Neck:     Musculoskeletal: Normal range of motion and neck supple.  Cardiovascular:     Rate and Rhythm: Normal rate and regular rhythm.     Heart sounds: No murmur. No friction rub. No gallop.   Pulmonary:     Effort: Pulmonary effort is normal. No respiratory distress.     Breath sounds: Normal breath sounds. No wheezing.  Abdominal:     General: Bowel sounds are normal. There is no distension.     Palpations: Abdomen is soft.     Tenderness: There is abdominal tenderness in the left lower quadrant. There is no right CVA tenderness, left CVA tenderness, guarding or rebound.  Musculoskeletal: Normal range of motion.  Skin:    General: Skin is warm and dry.  Neurological:     Mental Status: She is alert and oriented to  person, place, and time.      ED Treatments / Results  Labs (all labs ordered are listed, but only abnormal results are displayed) Labs Reviewed  BASIC METABOLIC PANEL  CBC WITH DIFFERENTIAL/PLATELET  URINALYSIS, ROUTINE W REFLEX MICROSCOPIC    EKG None  Radiology No results found.  Procedures Procedures (including critical care time)  Medications Ordered in ED Medications  sodium chloride 0.9 % bolus 1,000 mL (has no administration in time range)     Initial Impression / Assessment and Plan / ED Course  I have reviewed the triage vital signs and the nursing notes.  Pertinent labs & imaging results that were available during my care of the patient were reviewed by me and considered in my medical decision  making (see chart for details).  Patient with history of diverticulitis presenting with left lower quadrant pain for the past 2 days.  She is on an antibiotic that was prescribed for her by her primary doctor.  Patient states that she felt a "rupture" in this area and is concerned she may have perforated colon.  Patient has no fever and no white count.  Her CT scan shows no evidence for diverticulitis, diverticular abscess, perforation, or other surgical process.  It does show what appears to be epiploic appendagitis.  I will advised the patient to continue her pain medication and antibiotic and follow-up as needed.  Final Clinical Impressions(s) / ED Diagnoses   Final diagnoses:  None    ED Discharge Orders    None       Veryl Speak, MD 02/03/19 [phone]

## 2019-02-16 DIAGNOSIS — E7800 Pure hypercholesterolemia, unspecified: Secondary | ICD-10-CM | POA: Diagnosis not present

## 2019-02-16 DIAGNOSIS — Z0000 Encounter for general adult medical examination without abnormal findings: Secondary | ICD-10-CM | POA: Diagnosis not present

## 2019-02-16 DIAGNOSIS — E559 Vitamin D deficiency, unspecified: Secondary | ICD-10-CM | POA: Diagnosis not present

## 2019-02-16 DIAGNOSIS — M545 Low back pain: Secondary | ICD-10-CM | POA: Diagnosis not present

## 2019-02-16 DIAGNOSIS — M797 Fibromyalgia: Secondary | ICD-10-CM | POA: Diagnosis not present

## 2019-02-16 DIAGNOSIS — Z79899 Other long term (current) drug therapy: Secondary | ICD-10-CM | POA: Diagnosis not present

## 2019-02-16 DIAGNOSIS — E538 Deficiency of other specified B group vitamins: Secondary | ICD-10-CM | POA: Diagnosis not present

## 2019-02-23 ENCOUNTER — Other Ambulatory Visit: Payer: Self-pay | Admitting: Internal Medicine

## 2019-02-23 DIAGNOSIS — Z1231 Encounter for screening mammogram for malignant neoplasm of breast: Secondary | ICD-10-CM

## 2019-03-30 ENCOUNTER — Other Ambulatory Visit: Payer: Self-pay

## 2019-03-30 ENCOUNTER — Ambulatory Visit
Admission: RE | Admit: 2019-03-30 | Discharge: 2019-03-30 | Disposition: A | Discharge status: Home / Self Care | Payer: BC Managed Care – PPO | Source: Ambulatory Visit | Attending: Internal Medicine | Admitting: Internal Medicine

## 2019-03-30 ENCOUNTER — Other Ambulatory Visit: Payer: Self-pay | Admitting: Internal Medicine

## 2019-03-30 ENCOUNTER — Other Ambulatory Visit: Payer: BC Managed Care – PPO

## 2019-03-30 DIAGNOSIS — N6310 Unspecified lump in the right breast, unspecified quadrant: Secondary | ICD-10-CM

## 2019-03-30 DIAGNOSIS — R928 Other abnormal and inconclusive findings on diagnostic imaging of breast: Secondary | ICD-10-CM | POA: Diagnosis not present

## 2019-03-30 DIAGNOSIS — Z1231 Encounter for screening mammogram for malignant neoplasm of breast: Secondary | ICD-10-CM

## 2019-04-07 ENCOUNTER — Ambulatory Visit: Payer: BC Managed Care – PPO

## 2019-05-12 DIAGNOSIS — M797 Fibromyalgia: Secondary | ICD-10-CM | POA: Diagnosis not present

## 2019-05-12 DIAGNOSIS — M7580 Other shoulder lesions, unspecified shoulder: Secondary | ICD-10-CM | POA: Diagnosis not present

## 2019-05-12 DIAGNOSIS — M545 Low back pain: Secondary | ICD-10-CM | POA: Diagnosis not present

## 2019-05-12 DIAGNOSIS — M7581 Other shoulder lesions, right shoulder: Secondary | ICD-10-CM | POA: Diagnosis not present

## 2019-05-15 DIAGNOSIS — Z1159 Encounter for screening for other viral diseases: Secondary | ICD-10-CM | POA: Diagnosis not present

## 2019-05-20 DIAGNOSIS — K640 First degree hemorrhoids: Secondary | ICD-10-CM | POA: Diagnosis not present

## 2019-05-20 DIAGNOSIS — D122 Benign neoplasm of ascending colon: Secondary | ICD-10-CM | POA: Diagnosis not present

## 2019-05-20 DIAGNOSIS — D123 Benign neoplasm of transverse colon: Secondary | ICD-10-CM | POA: Diagnosis not present

## 2019-05-20 DIAGNOSIS — D125 Benign neoplasm of sigmoid colon: Secondary | ICD-10-CM | POA: Diagnosis not present

## 2019-05-20 DIAGNOSIS — Z1211 Encounter for screening for malignant neoplasm of colon: Secondary | ICD-10-CM | POA: Diagnosis not present

## 2019-08-06 DIAGNOSIS — Z658 Other specified problems related to psychosocial circumstances: Secondary | ICD-10-CM | POA: Diagnosis not present

## 2019-08-06 DIAGNOSIS — M545 Low back pain: Secondary | ICD-10-CM | POA: Diagnosis not present

## 2019-08-06 DIAGNOSIS — M797 Fibromyalgia: Secondary | ICD-10-CM | POA: Diagnosis not present

## 2019-08-25 DIAGNOSIS — M797 Fibromyalgia: Secondary | ICD-10-CM | POA: Diagnosis not present

## 2019-08-25 DIAGNOSIS — M545 Low back pain: Secondary | ICD-10-CM | POA: Diagnosis not present

## 2019-08-25 DIAGNOSIS — M7581 Other shoulder lesions, right shoulder: Secondary | ICD-10-CM | POA: Diagnosis not present

## 2019-11-03 DIAGNOSIS — M545 Low back pain: Secondary | ICD-10-CM | POA: Diagnosis not present

## 2019-11-03 DIAGNOSIS — M797 Fibromyalgia: Secondary | ICD-10-CM | POA: Diagnosis not present

## 2019-11-03 DIAGNOSIS — Z806 Family history of leukemia: Secondary | ICD-10-CM | POA: Diagnosis not present

## 2019-11-10 DIAGNOSIS — Z1152 Encounter for screening for COVID-19: Secondary | ICD-10-CM | POA: Diagnosis not present

## 2019-11-13 DIAGNOSIS — M546 Pain in thoracic spine: Secondary | ICD-10-CM | POA: Diagnosis not present

## 2020-01-19 DIAGNOSIS — Z85828 Personal history of other malignant neoplasm of skin: Secondary | ICD-10-CM | POA: Diagnosis not present

## 2020-01-19 DIAGNOSIS — L820 Inflamed seborrheic keratosis: Secondary | ICD-10-CM | POA: Diagnosis not present

## 2020-01-19 DIAGNOSIS — D225 Melanocytic nevi of trunk: Secondary | ICD-10-CM | POA: Diagnosis not present

## 2020-01-19 DIAGNOSIS — L814 Other melanin hyperpigmentation: Secondary | ICD-10-CM | POA: Diagnosis not present

## 2020-01-19 DIAGNOSIS — L821 Other seborrheic keratosis: Secondary | ICD-10-CM | POA: Diagnosis not present

## 2020-01-19 DIAGNOSIS — L570 Actinic keratosis: Secondary | ICD-10-CM | POA: Diagnosis not present

## 2020-02-01 DIAGNOSIS — M545 Low back pain: Secondary | ICD-10-CM | POA: Diagnosis not present

## 2020-02-01 DIAGNOSIS — M797 Fibromyalgia: Secondary | ICD-10-CM | POA: Diagnosis not present

## 2020-02-01 DIAGNOSIS — S4991XA Unspecified injury of right shoulder and upper arm, initial encounter: Secondary | ICD-10-CM | POA: Diagnosis not present

## 2020-02-10 ENCOUNTER — Ambulatory Visit (INDEPENDENT_AMBULATORY_CARE_PROVIDER_SITE_OTHER): Payer: BC Managed Care – PPO

## 2020-02-10 ENCOUNTER — Ambulatory Visit: Payer: BC Managed Care – PPO | Admitting: Orthopaedic Surgery

## 2020-02-10 ENCOUNTER — Other Ambulatory Visit: Payer: Self-pay

## 2020-02-10 ENCOUNTER — Encounter: Payer: Self-pay | Admitting: Orthopaedic Surgery

## 2020-02-10 DIAGNOSIS — M25511 Pain in right shoulder: Secondary | ICD-10-CM

## 2020-02-10 DIAGNOSIS — M7541 Impingement syndrome of right shoulder: Secondary | ICD-10-CM | POA: Diagnosis not present

## 2020-02-10 DIAGNOSIS — G8929 Other chronic pain: Secondary | ICD-10-CM

## 2020-02-10 MED ORDER — METHYLPREDNISOLONE ACETATE 40 MG/ML IJ SUSP
40.0000 mg | INTRAMUSCULAR | Status: AC | PRN
  Administered 2020-02-10: 40 mg via INTRA_ARTICULAR

## 2020-02-10 MED ORDER — LIDOCAINE HCL 1 % IJ SOLN
3.0000 mL | INTRAMUSCULAR | Status: AC | PRN
  Administered 2020-02-10: 3 mL

## 2020-02-10 NOTE — Progress Notes (Signed)
Office Visit Note   Patient: Carol Newman           Date of Birth: 1961-03-29           MRN: [id number] Visit Date: 02/10/2020              Requested by: Marden Noble, MD [address] [place],  [place] [address] PCP: Marden Noble, MD   Assessment & Plan: Visit Diagnoses:  1. Chronic right shoulder pain   2. Impingement syndrome of right shoulder     Plan: Certainly the patient may have a rotator cuff tear based on my clinical exam findings and x-ray findings.  I would like to try at least one subacromial steroid injection today in the right shoulder and try a few weeks of physical therapy to hopefully improve her shoulder function of the right shoulder with range of motion and strength.  She agrees with this treatment plan and did tolerate the steroid injection well.  I did describe the risk and benefits of steroid injections.  In 4 weeks if she has not made significant improvements we will then recommend a MRI of the right shoulder.  All questions and concerns were answered and addressed.  Follow-Up Instructions: Return in about 4 weeks (around 03/09/2020).   Orders:  Orders Placed This Encounter  Procedures  . Large Joint Inj  . XR Shoulder Right   No orders of the defined types were placed in this encounter.     Procedures: Large Joint Inj: R subacromial bursa on 02/10/2020 11:12 AM Indications: pain and diagnostic evaluation Details: 22 G 1.5 in needle  Arthrogram: No  Medications: 3 mL lidocaine 1 %; 40 mg methylPREDNISolone acetate 40 MG/ML Outcome: tolerated well, no immediate complications Procedure, treatment alternatives, risks and benefits explained, specific risks discussed. Consent was given by the patient. Immediately prior to procedure a time out was called to verify the correct patient, procedure, equipment, support staff and site/side marked as required. Patient was prepped and draped in the usual sterile fashion.       Clinical Data: No  additional findings.   Subjective: Chief Complaint  Patient presents with  . Right Shoulder - Pain  The patient is someone I am seeing for the first time.  This is for her right shoulder.  She is sent from Dr. Marden Noble her primary care physician to evaluate and treat chronic shoulder pain.  She has not had any issues with the shoulder until she fell on her right dominant shoulder in September of last year.  She has had at least 2 injections and that has not really helped her.  She is now starting to report significant pain and weakness with reaching behind her and overhead.  She is not a diabetic.  She is not having physical therapy for her shoulder.  She denies any neck pain denies any numbness and tingling right hand.  HPI  Review of Systems She currently denies any headache, chest pain, shortness of breath, fever, chills, nausea, vomiting  Objective: Vital Signs: There were no vitals taken for this visit.  Physical Exam She is alert and orient x3 and in no acute distress Ortho Exam Examination of her right shoulder shows actually pretty good motion except for internal rotation with adduction which she can reach to the mid lumbar spine on the right side where is on the left side she can reach up to her thoracic area.  She has negative liftoff.  She does hurt  with abduction of the shoulder and external rotation there is only some slight weakness but she is not really having to incorporate her deltoids and to abduct her shoulder. Specialty Comments:  No specialty comments available.  Imaging: XR Shoulder Right  Result Date: 02/10/2020 3 views of the right shoulder show no acute findings.  There is some mild AC joint arthritic changes.  I would say that the humeral head is slightly superior in the glenoid but this is only slight.    PMFS History: There are no problems to display for this patient.  Past Medical History:  Diagnosis Date  . Diverticulitis     History reviewed.  No pertinent family history.  History reviewed. No pertinent surgical history. Social History   Occupational History  . Not on file  Tobacco Use  . Smoking status: Never Smoker  . Smokeless tobacco: Never Used  Substance and Sexual Activity  . Alcohol use: Never  . Drug use: Never  . Sexual activity: Not on file

## 2020-02-17 ENCOUNTER — Ambulatory Visit: Payer: BC Managed Care – PPO | Admitting: Rehabilitative and Restorative Service Providers"

## 2020-02-17 ENCOUNTER — Encounter: Payer: Self-pay | Admitting: Rehabilitative and Restorative Service Providers"

## 2020-02-17 ENCOUNTER — Other Ambulatory Visit: Payer: Self-pay

## 2020-02-17 DIAGNOSIS — M25511 Pain in right shoulder: Secondary | ICD-10-CM | POA: Diagnosis not present

## 2020-02-17 DIAGNOSIS — M25611 Stiffness of right shoulder, not elsewhere classified: Secondary | ICD-10-CM | POA: Diagnosis not present

## 2020-02-17 DIAGNOSIS — G8929 Other chronic pain: Secondary | ICD-10-CM

## 2020-02-17 NOTE — Patient Instructions (Signed)
Access Code: [email or web address] URL: [email or web address] Date: 02/17/2020 Prepared by: Pauletta Browns  Exercises Standing Scapular Retraction - 5 x daily - 7 x weekly - 1 sets - 5 reps - 5 hold Supine Scapular Protraction in Flexion with Dumbbells - 2-3 x daily - 7 x weekly - 1 sets - 20 reps - 3 seconds hold Supine Shoulder Internal Rotation Stretch - 2-3 x daily - 7 x weekly - 1 sets - 10-20 reps - 10 secondsinutes hold Standing Shoulder Internal Rotation Stretch with Hands Behind Back - 5 x daily - 7 x weekly - 1 sets - 10 reps - 10 hold

## 2020-02-17 NOTE — Therapy (Signed)
[facility] Physical Therapy [address] [place], [place], [address] Phone: [phone]   Fax:  [phone]  Physical Therapy Evaluation  Patient Details  Name: Carol Newman MRN: [id number] Date of Birth: Nov 24, 1960 Referring Provider (PT): Doneen Poisson MD   Encounter Date: 02/17/2020   PT End of Session - 02/17/20 0937    Visit Number 1    Number of Visits 8    PT Start Time 0758    PT Stop Time 0844    PT Time Calculation (min) 46 min    Activity Tolerance Patient tolerated treatment well;No increased pain    Behavior During Therapy WFL for tasks assessed/performed           Past Medical History:  Diagnosis Date  . Diverticulitis     History reviewed. No pertinent surgical history.  There were no vitals filed for this visit.    Subjective Assessment - 02/17/20 0800    Subjective Fall in September 2020.  R shoulder injured.  Several cortisone injections and another fall in May of 2021.    Pertinent History NA    Patient Stated Goals Be able to use the R shoulder normally.    Currently in Pain? Yes    Pain Score 2     Pain Location Shoulder    Pain Orientation Right    Pain Descriptors / Indicators Aching;Tightness    Pain Type Chronic pain    Pain Radiating Towards Lateral arm    Pain Onset More than a month ago    Pain Frequency Intermittent    Aggravating Factors  Reaching behind back, reach to back seat, sleeping on R side    Pain Relieving Factors Ice    Effect of Pain on Daily Activities Sleep, reaching behind the back, reaching out or overhead              [facility] PT Assessment - 02/17/20 0001      Assessment   Medical Diagnosis R shoulder pain/possible RTC tear    Referring Provider (PT) Doneen Poisson MD    Onset Date/Surgical Date 03/03/19      Balance Screen   Has the patient fallen in the past 6 months Yes    How many times? 1    Has the patient had a decrease in activity level because of a fear of  falling?  No    Is the patient reluctant to leave their home because of a fear of falling?  No      Prior Function   Level of Independence Independent      Cognition   Overall Cognitive Status Within Functional Limits for tasks assessed      ROM / Strength   AROM / PROM / Strength AROM;Strength      AROM   Overall AROM  Deficits    AROM Assessment Site Shoulder    Right/Left Shoulder Left;Right    Right Shoulder Flexion 140 Degrees    Right Shoulder Internal Rotation 30 Degrees    Right Shoulder External Rotation 70 Degrees    Right Shoulder Horizontal  ADduction 15 Degrees    Left Shoulder Flexion 170 Degrees    Left Shoulder Internal Rotation 65 Degrees    Left Shoulder External Rotation 105 Degrees    Left Shoulder Horizontal ADduction 40 Degrees      Strength   Overall Strength Deficits    Strength Assessment Site Shoulder    Right/Left Shoulder Left;Right    Right Shoulder Internal Rotation 5/5  Right Shoulder External Rotation 4/5    Left Shoulder Internal Rotation 5/5    Left Shoulder External Rotation 4+/5                      Objective measurements completed on examination: See above findings.       [facility] Adult PT Treatment/Exercise - 02/17/20 0001      Exercises   Exercises Shoulder      Shoulder Exercises: Supine   Protraction Strengthening;Both;20 reps;Weights    Protraction Weight (lbs) 4#    Internal Rotation AROM;Right;10 reps   10 seconds     Shoulder Exercises: Standing   Internal Rotation AROM;Right;10 reps   10 seconds   Retraction Strengthening;Both;10 reps   5 seconds shoulder blade pinches                 PT Education - 02/17/20 0936    Education Details Discussed anatomy of the shoulder, mechanism/anatomy of impingement along with surgical and non-surgical signs and goals.  Prescribed and reviewed an appropriate HEP with emphasis on posterior capsule stretching.    Person(s) Educated Patient    Methods  Explanation;Demonstration;Tactile cues;Verbal cues;Handout    Comprehension Verbal cues required;Returned demonstration;Need further instruction;Tactile cues required;Verbalized understanding               PT Long Term Goals - 02/17/20 0943      PT LONG TERM GOAL #1   Title Adrianna will rate herself at least 80% improved functionally as compared to evaluation.    Time 8    Period Weeks    Status New    Target Date 04/15/20      PT LONG TERM GOAL #2   Title Jesyca will report R shoulder pain consistently 0-2/10 on the Numeric Pain Rating Scale.    Baseline Can be 5-7/10    Time 8    Period Weeks    Status New    Target Date 04/15/20      PT LONG TERM GOAL #3   Title Improve R shoulder AROM for flexion to 170; ER to 90; IR to 60 and horizontal adduction to 40 degrees to reduce impingement causing functional impairments.    Baseline 140; 70; 30 and 15 respectively.    Time 8    Period Weeks    Status New    Target Date 04/15/20      PT LONG TERM GOAL #4   Title Melvia will be independent with her final DC HEP for long-term maintenance.    Time 8    Period Weeks    Status New    Target Date 04/15/20                  Plan - 02/17/20 8338    Clinical Impression Statement Jaria has significant limitations in her R shoulder posterior capsule flexibility.  IR and HA AROM are very limited.  Strength is not terrible but will also be addressed.  Currently, her tight posterior capsule is putting upward pressure on her humeral head and pushing her into a position of impingement.  Restoring normal capsular flexibility should make a big difference in her sleep and R shoulder function.    Examination-Activity Limitations Dressing;Sleep;Reach Overhead;Carry    Examination-Participation Restrictions Community Activity    Stability/Clinical Decision Making Stable/Uncomplicated    Clinical Decision Making Low    Rehab Potential Good    PT Frequency 1x / week    PT Duration 8 weeks  PT Treatment/Interventions ADLs/Self Care Home Management;Moist Heat;Cryotherapy;Therapeutic activities;Therapeutic exercise;Neuromuscular re-education;Patient/family education;Manual techniques;Passive range of motion;Joint Manipulations    PT Next Visit Plan Posterior capsular stretching emphasis.  Scapular and RTC strengthening PRN.    PT Home Exercise Plan Access Code: [email or web address]           Patient will benefit from skilled therapeutic intervention in order to improve the following deficits and impairments:  Decreased range of motion, Decreased mobility, Hypomobility, Impaired UE functional use, Pain  Visit Diagnosis: Stiffness of right shoulder, not elsewhere classified  Chronic right shoulder pain     Problem List There are no problems to display for this patient.   Cherlyn Cushing PT, MPT 02/17/2020, 9:48 AM  [facility] Physical Therapy [address] [place], [place], [address] Phone: [phone]   Fax:  [phone]  Name: Damira Kem Bussa MRN: [id number] Date of Birth: 1961-03-10

## 2020-02-26 ENCOUNTER — Ambulatory Visit: Payer: BC Managed Care – PPO | Admitting: Rehabilitative and Restorative Service Providers"

## 2020-02-26 ENCOUNTER — Encounter: Payer: Self-pay | Admitting: Rehabilitative and Restorative Service Providers"

## 2020-02-26 ENCOUNTER — Other Ambulatory Visit: Payer: Self-pay

## 2020-02-26 DIAGNOSIS — G8929 Other chronic pain: Secondary | ICD-10-CM | POA: Diagnosis not present

## 2020-02-26 DIAGNOSIS — M25611 Stiffness of right shoulder, not elsewhere classified: Secondary | ICD-10-CM | POA: Diagnosis not present

## 2020-02-26 DIAGNOSIS — M25511 Pain in right shoulder: Secondary | ICD-10-CM | POA: Diagnosis not present

## 2020-02-26 NOTE — Therapy (Addendum)
[facility] Physical Therapy [address] [place], [place], [address] Phone: [phone]   Fax:  [phone]  Physical Therapy Treatment/DC  Patient Details  Name: Carol Newman MRN: [id number] Date of Birth: Jul 18, 1960 Referring Provider (PT): Jean Rosenthal MD   Encounter Date: 02/26/2020   PT End of Session - 02/26/20 1352    Visit Number 2    Number of Visits 8    PT Start Time 1300    PT Stop Time 4627    PT Time Calculation (min) 42 min    Activity Tolerance Patient tolerated treatment well;No increased pain    Behavior During Therapy WFL for tasks assessed/performed           Past Medical History:  Diagnosis Date  . Diverticulitis     History reviewed. No pertinent surgical history.  There were no vitals filed for this visit.   Subjective Assessment - 02/26/20 1347    Subjective Carol Newman reports that she is moving better although the thumb up the back stretch is difficult.    Pertinent History NA    Patient Stated Goals Be able to use the R shoulder normally.    Currently in Pain? Yes    Pain Score 2     Pain Location Shoulder    Pain Orientation Right    Pain Descriptors / Indicators Aching;Tightness    Pain Type Chronic pain    Pain Radiating Towards Lateral arm    Pain Onset More than a month ago    Pain Frequency Intermittent    Aggravating Factors  Reaching behind back (bra strap), reaching into back seat of the car from the front seat and sleeping on the R side    Pain Relieving Factors Ice    Effect of Pain on Daily Activities See above              [facility] PT Assessment - 02/26/20 0001      AROM   Right Shoulder Flexion 155 Degrees    Right Shoulder Internal Rotation 70 Degrees    Right Shoulder External Rotation 80 Degrees    Right Shoulder Horizontal  ADduction 40 Degrees                         [facility] Adult PT Treatment/Exercise - 02/26/20 0001      Exercises   Exercises Shoulder      Shoulder  Exercises: Supine   Protraction Strengthening;Both;20 reps;Weights    Protraction Weight (lbs) 5#    External Rotation AROM;Right;10 reps   10 seconds at 80 degrees abduction   Internal Rotation AROM;Right;10 reps   10 seconds   Flexion AROM;Right;10 reps   5 seconds (protract 1st) palm in     Shoulder Exercises: Standing   External Rotation Strengthening;Right;10 reps   2 sets slow eccentrics   Theraband Level (Shoulder External Rotation) Level 2 (Red)    Internal Rotation AROM;Right;10 reps;Strengthening;Theraband   10 seconds (thumb up back) and slow eccentrics theraband   Theraband Level (Shoulder Internal Rotation) Level 2 (Red)    Retraction Strengthening;Both;10 reps   5 seconds shoulder blade pinches                 PT Education - 02/26/20 1349    Education Details Reviewed HEP, corrected thumb up the back stretch (less aggressive) and added supine ER stretch.    Person(s) Educated Patient    Methods Explanation;Demonstration;Verbal cues;Tactile cues;Handout    Comprehension Need further instruction;Returned  demonstration;Verbal cues required;Tactile cues required;Verbalized understanding               PT Long Term Goals - 02/26/20 1351      PT LONG TERM GOAL #1   Title Carol Newman will rate herself at least 80% improved functionally as compared to evaluation.    Time 8    Period Weeks    Status On-going      PT LONG TERM GOAL #2   Title Carol Newman will report R shoulder pain consistently 0-2/10 on the Numeric Pain Rating Scale.    Baseline Can be 5-7/10    Time 8    Period Weeks    Status On-going      PT LONG TERM GOAL #3   Title Improve R shoulder AROM for flexion to 170; ER to 90; IR to 60 and horizontal adduction to 40 degrees to reduce impingement causing functional impairments.    Baseline 140; 70; 30 and 15 respectively at evaluation.  155; 80; 70 and 40 02/26/20.    Time 8    Period Weeks    Status Partially Met      PT LONG TERM GOAL #4   Title Carol Newman  will be independent with her final DC HEP for long-term maintenance.    Time 8    Period Weeks    Status On-going                 Plan - 02/26/20 1353    Clinical Impression Statement Carol Newman increased AROM as compared to evaluation.  Thumb up the back was corrected to be less aggressive and early AROM progress was noted.  RTC and scapular strength progressions were made (weights and theraband) and will be a focus next week.    Examination-Activity Limitations Dressing;Sleep;Reach Overhead;Carry    Examination-Participation Restrictions Community Activity    Stability/Clinical Decision Making Stable/Uncomplicated    Rehab Potential Good    PT Frequency 1x / week    PT Duration 8 weeks    PT Treatment/Interventions ADLs/Self Care Home Management;Moist Heat;Cryotherapy;Therapeutic activities;Therapeutic exercise;Neuromuscular re-education;Patient/family education;Manual techniques;Passive range of motion;Joint Manipulations    PT Next Visit Plan Posterior capsular stretching emphasis.  Scapular and RTC strengthening as well.    PT Home Exercise Plan Access Code: [email or web address]           Patient will benefit from skilled therapeutic intervention in order to improve the following deficits and impairments:  Decreased range of motion, Decreased mobility, Hypomobility, Impaired UE functional use, Pain  Visit Diagnosis: Stiffness of right shoulder, not elsewhere classified  Chronic right shoulder pain     Problem List There are no problems to display for this patient.   Farley Ly PT, MPT 02/26/2020, 1:57 PM  [facility] Physical Therapy [address] [place], [place], [address] Phone: [phone]   Fax:  [phone]  Name: Carol Newman MRN: [id number] Date of Birth: April 07, 1961   PHYSICAL THERAPY DISCHARGE SUMMARY  Visits from Start of Care: 2  Current functional level related to goals / functional outcomes: See note   Remaining deficits: See  note   Education / Equipment: HEP  Plan:                                                    Patient goals were not met. Patient is being discharged due  to the patient's request.  ?????

## 2020-02-26 NOTE — Patient Instructions (Signed)
Access Code: [email or web address] URL: [email or web address] Date: 02/26/2020 Prepared by: Pauletta Browns  Exercises Supine Shoulder External Rotation Stretch - 1-2 x daily - 7 x weekly - 1 sets - 10-20 reps - 10 seconds hold

## 2020-02-29 ENCOUNTER — Encounter: Payer: BC Managed Care – PPO | Admitting: Rehabilitative and Restorative Service Providers"

## 2020-03-09 ENCOUNTER — Ambulatory Visit: Payer: BC Managed Care – PPO | Admitting: Orthopaedic Surgery

## 2020-03-09 ENCOUNTER — Encounter: Payer: Self-pay | Admitting: Orthopaedic Surgery

## 2020-03-09 ENCOUNTER — Ambulatory Visit: Payer: Self-pay

## 2020-03-09 DIAGNOSIS — M1711 Unilateral primary osteoarthritis, right knee: Secondary | ICD-10-CM

## 2020-03-09 MED ORDER — METHYLPREDNISOLONE ACETATE 40 MG/ML IJ SUSP
40.0000 mg | INTRAMUSCULAR | Status: AC | PRN
  Administered 2020-03-09: 40 mg via INTRA_ARTICULAR

## 2020-03-09 MED ORDER — LIDOCAINE HCL 1 % IJ SOLN
3.0000 mL | INTRAMUSCULAR | Status: AC | PRN
  Administered 2020-03-09: 3 mL

## 2020-03-09 NOTE — Progress Notes (Signed)
Office Visit Note   Patient: Carol Newman           Date of Birth: [birth year]           MRN: [id number] Visit Date: 03/09/2020              Requested by: Marden Noble, MD [address] [place],  [place] [address] PCP: Marden Noble, MD   Assessment & Plan: Visit Diagnoses:  1. Primary osteoarthritis of right knee     Plan: Discussed with the patient knee friendly exercises.  Showed her quad strengthening exercises.  She will follow-up with [place] on an as-needed basis.  Discussed with her mechanical symptoms, swelling and continued pain is being reasons for her to return.  Questions were encouraged and answered.  She will continue to work on home exercises as shown by therapy for her right shoulder.  Follow-Up Instructions: Return if symptoms worsen or fail to improve.   Orders:  Orders Placed This Encounter  Procedures  . Large Joint Inj: R knee  . XR Knee 1-2 Views Right   No orders of the defined types were placed in this encounter.     Procedures: Large Joint Inj: R knee on 03/09/2020 8:54 AM Indications: pain Details: 22 G 1.5 in needle, anterolateral approach  Arthrogram: No  Medications: 3 mL lidocaine 1 %; 40 mg methylPREDNISolone acetate 40 MG/ML Aspirate: 16 mL yellow Outcome: tolerated well, no immediate complications Procedure, treatment alternatives, risks and benefits explained, specific risks discussed. Consent was given by the patient. Immediately prior to procedure a time out was called to verify the correct patient, procedure, equipment, support staff and site/side marked as required. Patient was prepped and draped in the usual sterile fashion.       Clinical Data: No additional findings.   Subjective: Chief Complaint  Patient presents with  . Right Shoulder - Follow-up    HPI Carol Newman comes in today for wound follow-up of her right shoulder.  She states her right shoulder is doing great.  Feels therapy is definitely helped she is  doing home exercise program.  She however is now having right knee pain which started this weekend.  She notes swelling in the knee.  Also some giving way sensation of the knee.  Patient does have fibromyalgia.  Review of Systems  Constitutional: Negative for chills and fever.  Musculoskeletal: Positive for arthralgias.     Objective: Vital Signs: There were no vitals taken for this visit.  Physical Exam Constitutional:      Appearance: She is not ill-appearing or diaphoretic.  Neurological:     Mental Status: She is alert.     Ortho Exam Bilateral knees: No abnormal warmth erythema or ecchymosis.  Right knee with positive effusion.  Right knee good range of motion.  Tenderness along lateral joint line.  No instability valgus varus stressing of the right ankle. Right shoulder: She has full range of motion of the right shoulder pain today. Specialty Comments:  No specialty comments available.  Imaging: XR Knee 1-2 Views Right  Result Date: 03/09/2020 Right knee 2 views: Shows significant patellofemoral arthritic changes.  Mild narrowing medial joint line.  No acute fractures or bony abnormalities otherwise.  Knee is well located.    PMFS History: There are no problems to display for this patient.  Past Medical History:  Diagnosis Date  . Diverticulitis     No family history on file.  No past surgical history on file. Social  History   Occupational History  . Not on file  Tobacco Use  . Smoking status: Never Smoker  . Smokeless tobacco: Never Used  Substance and Sexual Activity  . Alcohol use: Never  . Drug use: Never  . Sexual activity: Not on file

## 2020-03-14 ENCOUNTER — Other Ambulatory Visit: Payer: Self-pay | Admitting: Internal Medicine

## 2020-03-14 DIAGNOSIS — Z1231 Encounter for screening mammogram for malignant neoplasm of breast: Secondary | ICD-10-CM

## 2020-03-31 ENCOUNTER — Ambulatory Visit
Admission: RE | Admit: 2020-03-31 | Discharge: 2020-03-31 | Disposition: A | Discharge status: Home / Self Care | Payer: BC Managed Care – PPO | Source: Ambulatory Visit | Attending: Internal Medicine | Admitting: Internal Medicine

## 2020-03-31 ENCOUNTER — Other Ambulatory Visit: Payer: Self-pay

## 2020-03-31 DIAGNOSIS — Z1231 Encounter for screening mammogram for malignant neoplasm of breast: Secondary | ICD-10-CM

## 2020-04-06 ENCOUNTER — Other Ambulatory Visit (HOSPITAL_COMMUNITY): Payer: Self-pay | Admitting: Internal Medicine

## 2020-04-06 ENCOUNTER — Ambulatory Visit: Payer: BC Managed Care – PPO | Attending: Internal Medicine

## 2020-04-06 DIAGNOSIS — Z23 Encounter for immunization: Secondary | ICD-10-CM

## 2020-04-06 NOTE — Progress Notes (Signed)
   Covid-19 Vaccination Clinic  Name:  Carol Newman    MRN: [id number] DOB: 08/11/1960  04/06/2020  [name] was observed post Covid-19 immunization for 30 minutes based on pre-vaccination screening without incident. She was provided with Vaccine Information Sheet and instruction to access the V-Safe system.   [name] was instructed to call 911 with any severe reactions post vaccine: Marland Kitchen Difficulty breathing  . Swelling of face and throat  . A fast heartbeat  . A bad rash all over body  . Dizziness and weakness   Immunizations Administered    Name Date Dose VIS Date Route   JANSSEN COVID-19 VACCINE 04/06/2020  9:55 AM 0.5 mL 08/29/2019 Intramuscular   Manufacturer: [employer]   Lot: [id number]   NDC: 63875-643-32

## 2020-04-20 ENCOUNTER — Encounter: Payer: Self-pay | Admitting: Physician Assistant

## 2020-04-20 ENCOUNTER — Ambulatory Visit: Payer: BC Managed Care – PPO | Admitting: Physician Assistant

## 2020-04-20 ENCOUNTER — Ambulatory Visit: Payer: Self-pay

## 2020-04-20 VITALS — Ht 68.0 in | Wt 201.8 lb

## 2020-04-20 DIAGNOSIS — M25562 Pain in left knee: Secondary | ICD-10-CM

## 2020-04-20 MED ORDER — LIDOCAINE HCL 1 % IJ SOLN
3.0000 mL | INTRAMUSCULAR | Status: AC | PRN
  Administered 2020-04-20: 3 mL

## 2020-04-20 MED ORDER — METHYLPREDNISOLONE ACETATE 40 MG/ML IJ SUSP
40.0000 mg | INTRAMUSCULAR | Status: AC | PRN
  Administered 2020-04-20: 40 mg via INTRA_ARTICULAR

## 2020-04-20 NOTE — Progress Notes (Addendum)
Office Visit Note   Patient: Carol Newman           Date of Birth: 11-Oct-1960           MRN: [id number] Visit Date: 04/20/2020              Requested by: Marden Noble, MD [address] [place],  [place] [address] PCP: Marden Noble, MD   Assessment & Plan: Visit Diagnoses:  1. Acute pain of left knee     Plan: We will have her follow-up with [place] in 2 weeks see what type of response she had to the injection.  She is to be mindful of mechanical symptoms of the knee.  The knee was wrapped with an Ace bandage today which she will remove this evening.  She is activities as tolerated.  Questions were encouraged and he is  Follow-Up Instructions: Return in about 2 weeks (around 05/04/2020).   Orders:  Orders Placed This Encounter  Procedures  . Large Joint Inj  . XR Knee 1-2 Views Left   No orders of the defined types were placed in this encounter.     Procedures: Large Joint Inj: L knee on 04/20/2020 3:07 PM Indications: pain and joint swelling Details: 22 G 1.5 in needle, superolateral approach  Arthrogram: No  Medications: 3 mL lidocaine 1 %; 40 mg methylPREDNISolone acetate 40 MG/ML Aspirate: 38 mL yellow and blood-tinged Outcome: tolerated well, no immediate complications Procedure, treatment alternatives, risks and benefits explained, specific risks discussed. Consent was given by the patient. Immediately prior to procedure a time out was called to verify the correct patient, procedure, equipment, support staff and site/side marked as required. Patient was prepped and draped in the usual sterile fashion.       Clinical Data: No additional findings.   Subjective: Chief Complaint  Patient presents with  . Left Knee - Pain    HPI Mrs. Carol Newman is well-known to Dr. Magnus Newman service comes in today due to left knee pain.  She reports last Tuesday she hit her knee on the steering wheel.  She had some soreness in the and felt as if the knee needed to be  distal walked out.  Therefore she went for a walk and after having gone on the walk she developed severe left knee pain and swelling.  She states knee feels like it wants to give out on her.  She had no prior pain in the left knee.  Denies any other injuries.  Reports Covid booster injection 2 weeks ago.  Review of Systems No fevers or chills.  Objective: Vital Signs: Ht 5\' 8"  (1.727 m)   Wt 201 lb 12.8 oz (91.5 kg)   BMI 30.68 kg/m   Physical Exam Constitutional:      Appearance: She is not ill-appearing or diaphoretic.  Pulmonary:     Effort: Pulmonary effort is normal.  Neurological:     Mental Status: She is alert and oriented to person, place, and time.  Psychiatric:        Mood and Affect: Mood normal.     Ortho Exam Bilateral knees full range of motion except for the left knee with flexion which is slightly limited due to pain and swelling.  Left knee no abnormal warmth.  Positive effusion left knee.  No ecchymosis or erythema of either knee.  She is able do straight leg raise on the left.  Positive McMurray's on the left negative on the right.  Tenderness along medial  and lateral joint lines left knee.  Tenderness also left knee at the quad insertion. Specialty Comments:  No specialty comments available.  Imaging: XR Knee 1-2 Views Left  Result Date: 04/20/2020 Left knee 3 views: No acute fractures.  Knee is well located.  Patellofemoral changes mild to moderate.  Positive effusion.  Otherwise medial lateral joint lines well-maintained.    PMFS History: There are no problems to display for this patient.  Past Medical History:  Diagnosis Date  . Diverticulitis     History reviewed. No pertinent family history.  History reviewed. No pertinent surgical history. Social History   Occupational History  . Not on file  Tobacco Use  . Smoking status: Never Smoker  . Smokeless tobacco: Never Used  Substance and Sexual Activity  . Alcohol use: Never  . Drug use:  Never  . Sexual activity: Not on file

## 2020-04-28 DIAGNOSIS — E7800 Pure hypercholesterolemia, unspecified: Secondary | ICD-10-CM | POA: Diagnosis not present

## 2020-04-28 DIAGNOSIS — E559 Vitamin D deficiency, unspecified: Secondary | ICD-10-CM | POA: Diagnosis not present

## 2020-04-28 DIAGNOSIS — Z0000 Encounter for general adult medical examination without abnormal findings: Secondary | ICD-10-CM | POA: Diagnosis not present

## 2020-04-28 DIAGNOSIS — E538 Deficiency of other specified B group vitamins: Secondary | ICD-10-CM | POA: Diagnosis not present

## 2020-04-28 DIAGNOSIS — F419 Anxiety disorder, unspecified: Secondary | ICD-10-CM | POA: Diagnosis not present

## 2020-04-28 DIAGNOSIS — N9089 Other specified noninflammatory disorders of vulva and perineum: Secondary | ICD-10-CM | POA: Diagnosis not present

## 2020-04-28 DIAGNOSIS — Z86010 Personal history of colonic polyps: Secondary | ICD-10-CM | POA: Diagnosis not present

## 2020-05-04 ENCOUNTER — Encounter: Payer: Self-pay | Admitting: Physician Assistant

## 2020-05-04 ENCOUNTER — Ambulatory Visit: Payer: BC Managed Care – PPO | Admitting: Physician Assistant

## 2020-05-04 DIAGNOSIS — M25562 Pain in left knee: Secondary | ICD-10-CM

## 2020-05-04 NOTE — Progress Notes (Signed)
HPI: Mrs. Palmieri returns today follow-up on her left knee.  She states that she is seen as the injection did help the best for short period time.  She still having giving way of the knee.  She is also developed locking of the knee states that her knee became off yesterday in which she had a tough time getting it straight.  Physical exam: Left knee she has tenderness along the medial lateral joint lines.  No abnormal warmth erythema or effusion.  Crepitus with passive range of motion.  Impression: Left knee pain  Plan: Given the fact the patient is having mechanical symptoms and has failed conservative treatment which is included time and cortisone injection recommend MRI of the left knee to rule out a meniscal tear.  Have her follow-up after the MRI to go over results and discuss further treatment.  Questions encouraged and answered.

## 2020-05-04 NOTE — Addendum Note (Signed)
Addended by: Barbette Or on: 05/04/2020 03:44 PM   Modules accepted: Orders

## 2020-05-05 ENCOUNTER — Other Ambulatory Visit: Payer: Self-pay | Admitting: Internal Medicine

## 2020-05-05 DIAGNOSIS — E2839 Other primary ovarian failure: Secondary | ICD-10-CM

## 2020-05-21 ENCOUNTER — Ambulatory Visit
Admission: RE | Admit: 2020-05-21 | Discharge: 2020-05-21 | Disposition: A | Discharge status: Home / Self Care | Payer: BC Managed Care – PPO | Source: Ambulatory Visit | Attending: Physician Assistant | Admitting: Physician Assistant

## 2020-05-21 DIAGNOSIS — M25562 Pain in left knee: Secondary | ICD-10-CM

## 2020-05-23 ENCOUNTER — Telehealth: Payer: Self-pay | Admitting: Physician Assistant

## 2020-05-23 ENCOUNTER — Ambulatory Visit: Payer: BC Managed Care – PPO | Admitting: Physician Assistant

## 2020-05-23 NOTE — Telephone Encounter (Signed)
Pt had to leave her MRI review appt as she had another appt to go to and would like to know if Bronson Curb would read her the results over the phone and pt states she will make a F/U appt if need be.   [phone]

## 2020-05-24 ENCOUNTER — Telehealth: Payer: Self-pay

## 2020-05-24 NOTE — Telephone Encounter (Signed)
Submitted VOB, SynviscOne, left knee. 

## 2020-06-01 ENCOUNTER — Telehealth: Payer: Self-pay

## 2020-06-01 NOTE — Telephone Encounter (Signed)
PA required for SynviscOne, left knee. Faxed completed PA form to [employer] at [phone]. 

## 2020-06-03 ENCOUNTER — Telehealth: Payer: Self-pay

## 2020-06-03 NOTE — Telephone Encounter (Signed)
Called and scheduled. Pt is aware of copay 

## 2020-06-03 NOTE — Telephone Encounter (Signed)
Approved for Synvisc One-Left knee Dr. Magnus Ivan $40 copay then covered @ 100% Prior auth required Auth # [id number] Dates: 06/01/20-11/28/20

## 2020-06-13 ENCOUNTER — Encounter: Payer: Self-pay | Admitting: Physician Assistant

## 2020-06-13 ENCOUNTER — Ambulatory Visit: Payer: BC Managed Care – PPO | Admitting: Physician Assistant

## 2020-06-13 VITALS — Ht 68.0 in | Wt 201.0 lb

## 2020-06-13 DIAGNOSIS — M1712 Unilateral primary osteoarthritis, left knee: Secondary | ICD-10-CM

## 2020-06-13 MED ORDER — HYLAN G-F 20 48 MG/6ML IX SOSY
48.0000 mg | PREFILLED_SYRINGE | INTRA_ARTICULAR | Status: AC | PRN
  Administered 2020-06-13: 48 mg via INTRA_ARTICULAR

## 2020-06-13 MED ORDER — LIDOCAINE HCL 1 % IJ SOLN
0.5000 mL | INTRAMUSCULAR | Status: AC | PRN
  Administered 2020-06-13: .5 mL

## 2020-06-13 NOTE — Progress Notes (Signed)
   Procedure Note  Patient: Carol Newman             Date of Birth: June 11, 1961           MRN: [id number]             Visit Date: 06/13/2020 HPI: Carol Newman comes in today for Synvisc 1 injection left knee.  She has had no new injury to the left knee.  Is having pain mostly medial aspect of the knee.  Given the MRI did show arthritic changes involving medial compartment patellofemoral compartment with areas of full-thickness loss of cartilage medial compartment.  Physical exam: Left knee good range of motion without pain.  No effusion abnormal warmth erythema left knee. Procedures: Visit Diagnoses:  1. Primary osteoarthritis of left knee     Large Joint Inj: L knee on 06/13/2020 8:42 AM Indications: pain Details: 22 G 1.5 in needle, anterolateral approach  Arthrogram: No  Medications: 0.5 mL lidocaine 1 %; 48 mg Hylan 48 MG/6ML Outcome: tolerated well, no immediate complications Procedure, treatment alternatives, risks and benefits explained, specific risks discussed. Consent was given by the patient. Immediately prior to procedure a time out was called to verify the correct patient, procedure, equipment, support staff and site/side marked as required. Patient was prepped and draped in the usual sterile fashion.    Plan: She will continue work on strengthening the left knee.  Follow-up with [place] as needed understands to wait at least 6 months between supplemental injections.

## 2020-07-27 DIAGNOSIS — M797 Fibromyalgia: Secondary | ICD-10-CM | POA: Diagnosis not present

## 2020-08-16 ENCOUNTER — Other Ambulatory Visit: Payer: BC Managed Care – PPO

## 2020-09-06 DIAGNOSIS — L570 Actinic keratosis: Secondary | ICD-10-CM | POA: Diagnosis not present

## 2020-10-27 DIAGNOSIS — M797 Fibromyalgia: Secondary | ICD-10-CM | POA: Diagnosis not present

## 2020-10-27 DIAGNOSIS — E2839 Other primary ovarian failure: Secondary | ICD-10-CM | POA: Diagnosis not present

## 2020-10-27 DIAGNOSIS — U071 COVID-19: Secondary | ICD-10-CM | POA: Diagnosis not present

## 2020-10-27 DIAGNOSIS — R0989 Other specified symptoms and signs involving the circulatory and respiratory systems: Secondary | ICD-10-CM | POA: Diagnosis not present

## 2020-11-17 DIAGNOSIS — Z20822 Contact with and (suspected) exposure to covid-19: Secondary | ICD-10-CM | POA: Diagnosis not present

## 2020-12-13 ENCOUNTER — Telehealth: Payer: Self-pay

## 2020-12-13 NOTE — Telephone Encounter (Signed)
Noted  

## 2020-12-13 NOTE — Telephone Encounter (Signed)
Patient called she wants to be pre approval process started for a gel injection, last injection received was 06/13/2020 call back:[phone]

## 2020-12-26 ENCOUNTER — Telehealth: Payer: Self-pay | Admitting: Physician Assistant

## 2020-12-26 ENCOUNTER — Telehealth: Payer: Self-pay

## 2020-12-26 NOTE — Telephone Encounter (Signed)
Pt calling wanting to follow up on if her injection can get scheduled. Pt told it can take two weeks for insurance to verify and wanted to check on progress. The best call back number is [phone].

## 2020-12-26 NOTE — Telephone Encounter (Signed)
Talked with patient concerning gel injection.  Advised patient that I will give her call when gel injection is approved.  Patient voiced that she understands.

## 2020-12-26 NOTE — Telephone Encounter (Signed)
VOB submitted for SynviscOne, left knee. Pending BV. 

## 2020-12-28 ENCOUNTER — Telehealth: Payer: Self-pay

## 2020-12-28 NOTE — Telephone Encounter (Signed)
PA required for SynviscOne, left knee. PA submitted online through [employer]. Pending PA# [id number]

## 2021-01-04 ENCOUNTER — Telehealth: Payer: Self-pay

## 2021-01-04 NOTE — Telephone Encounter (Signed)
Can you have her come back in for an updated visit with gil please

## 2021-01-04 NOTE — Telephone Encounter (Signed)
PA for SynviscOne, left knee will not be approved until patient has less pain, used fewer pain medication, and had significant improvement in activities of daily living since the last knee injection per [employer].  Please advise.  Thank you.

## 2021-01-04 NOTE — Telephone Encounter (Signed)
Appt.scheduled for 01/12/2021 with Bronson Curb.

## 2021-01-12 ENCOUNTER — Telehealth: Payer: Self-pay

## 2021-01-12 ENCOUNTER — Ambulatory Visit (INDEPENDENT_AMBULATORY_CARE_PROVIDER_SITE_OTHER): Payer: BC Managed Care – PPO | Admitting: Physician Assistant

## 2021-01-12 ENCOUNTER — Encounter: Payer: Self-pay | Admitting: Physician Assistant

## 2021-01-12 DIAGNOSIS — M1711 Unilateral primary osteoarthritis, right knee: Secondary | ICD-10-CM | POA: Diagnosis not present

## 2021-01-12 DIAGNOSIS — M1712 Unilateral primary osteoarthritis, left knee: Secondary | ICD-10-CM | POA: Diagnosis not present

## 2021-01-12 NOTE — Telephone Encounter (Signed)
Noted! Thank you

## 2021-01-12 NOTE — Progress Notes (Signed)
   Office Visit Note   Patient: Carol Newman           Date of Birth: [birth year]           MRN: [id number] Visit Date: 01/12/2021              Requested by: Marden Noble, MD [address] [place],  [place] [address] PCP: Marden Noble, MD   Assessment & Plan: Visit Diagnoses:  1. Primary osteoarthritis of left knee   2. Primary osteoarthritis of right knee     Plan: Recommend supplemental injections for both knees.  Patient has done well with supplemental injection in the past and left knee.  We will try to gain approval for supplemental injections in both knees and have her back once available.  Questions were encouraged and answered at length.  Follow-Up Instructions: Return for Supplemental injection.   Orders:  No orders of the defined types were placed in this encounter.  No orders of the defined types were placed in this encounter.     Procedures: No procedures performed   Clinical Data: No additional findings.   Subjective: Chief Complaint  Patient presents with   Left Knee - Follow-up    HPI  Carol Newman is a 60 year old female comes in today to document her left knee response to supplemental injection June 03, 2020.  She states that the injection of the left knee gave her good relief until about May and now that knee feels a little unsteady and is giving way.  She has had no injury to the knee.  She states that from December till May she was able to increase her activity due to the fact that the knee was not bothersome.  She also has osteoarthritis of her right knee and is wondering if she could have a cortisone injection into the right knee.  Radiographs of her right knee performed 03/09/2020 showed significant patellofemoral arthritic changes and mild narrowing medial joint line.   Review of Systems  Constitutional:  Negative for chills and fever.  Respiratory:  Negative for shortness of breath.  +     Objective: Vital Signs: There were no  vitals taken for this visit.  Physical Exam General: Well-developed well-nourished female no acute distress mood affect appropriate.   Psych: Alert and oriented x3  Ortho Exam Bilateral knees she has full extension full flexion.  No abnormal warmth erythema.  Patellofemoral crepitus with passive range of motion both knees.  Tenderness anterior lateral aspect left knee only.  No abnormal warmth erythema or effusion of either knee. Specialty Comments:  No specialty comments available.  Imaging: No results found.   PMFS History: There are no problems to display for this patient.  Past Medical History:  Diagnosis Date   Diverticulitis     History reviewed. No pertinent family history.  History reviewed. No pertinent surgical history. Social History   Occupational History   Not on file  Tobacco Use   Smoking status: Never   Smokeless tobacco: Never  Substance and Sexual Activity   Alcohol use: Never   Drug use: Never   Sexual activity: Not on file

## 2021-01-12 NOTE — Telephone Encounter (Signed)
We saw patient and documented on her to re-try for the gel injection

## 2021-01-13 NOTE — Telephone Encounter (Signed)
Faxed updated office note to [employer] at [phone] Reference# [id number]

## 2021-01-18 DIAGNOSIS — Z85828 Personal history of other malignant neoplasm of skin: Secondary | ICD-10-CM | POA: Diagnosis not present

## 2021-01-18 DIAGNOSIS — D225 Melanocytic nevi of trunk: Secondary | ICD-10-CM | POA: Diagnosis not present

## 2021-01-18 DIAGNOSIS — L821 Other seborrheic keratosis: Secondary | ICD-10-CM | POA: Diagnosis not present

## 2021-01-18 DIAGNOSIS — L814 Other melanin hyperpigmentation: Secondary | ICD-10-CM | POA: Diagnosis not present

## 2021-01-19 ENCOUNTER — Telehealth: Payer: Self-pay

## 2021-01-19 DIAGNOSIS — G43009 Migraine without aura, not intractable, without status migrainosus: Secondary | ICD-10-CM | POA: Diagnosis not present

## 2021-01-19 DIAGNOSIS — E2839 Other primary ovarian failure: Secondary | ICD-10-CM | POA: Diagnosis not present

## 2021-01-19 DIAGNOSIS — M797 Fibromyalgia: Secondary | ICD-10-CM | POA: Diagnosis not present

## 2021-01-19 NOTE — Telephone Encounter (Signed)
VOB submitted SynviscOne, right knee. Pending BV.

## 2021-01-19 NOTE — Telephone Encounter (Signed)
Approved for SynviscOne, left knee. Buy & Bill Must meet deductible first Patient will be responsible for 20% OOP. No Co-pay PA Approval# [id number] Valid 12/28/2020- 06/28/2021  Appt. 02/02/2021 with Richardean Canal

## 2021-01-23 ENCOUNTER — Telehealth: Payer: Self-pay

## 2021-01-23 NOTE — Telephone Encounter (Signed)
PA required for SynviscOne, right knee. Submitted PA online through [employer]. Pending PA# [id number]

## 2021-01-24 ENCOUNTER — Telehealth: Payer: Self-pay

## 2021-01-24 NOTE — Telephone Encounter (Addendum)
Approved for SynviscOne, right knee. Buy & Bill Must meet deductible first Patient will be responsible for 20% OOP. No Co-pay PA Approval# [id number] Valid 01/23/2021- 07/21/2021  Appt. 02/02/2021 with Richardean Canal

## 2021-02-02 ENCOUNTER — Encounter: Payer: Self-pay | Admitting: Physician Assistant

## 2021-02-02 ENCOUNTER — Ambulatory Visit (INDEPENDENT_AMBULATORY_CARE_PROVIDER_SITE_OTHER): Payer: BC Managed Care – PPO | Admitting: Physician Assistant

## 2021-02-02 ENCOUNTER — Other Ambulatory Visit: Payer: Self-pay

## 2021-02-02 DIAGNOSIS — M1712 Unilateral primary osteoarthritis, left knee: Secondary | ICD-10-CM

## 2021-02-02 DIAGNOSIS — M1711 Unilateral primary osteoarthritis, right knee: Secondary | ICD-10-CM

## 2021-02-02 MED ORDER — LIDOCAINE HCL 1 % IJ SOLN
3.0000 mL | INTRAMUSCULAR | Status: AC | PRN
  Administered 2021-02-02: 3 mL

## 2021-02-02 MED ORDER — HYLAN G-F 20 48 MG/6ML IX SOSY
48.0000 mg | PREFILLED_SYRINGE | INTRA_ARTICULAR | Status: AC | PRN
  Administered 2021-02-02: 48 mg via INTRA_ARTICULAR

## 2021-02-02 NOTE — Progress Notes (Signed)
   Procedure Note  Patient: Carol Newman             Date of Birth: March 24, 1961           MRN: [id number]             Visit Date: 02/02/2021 HPI: Carol Newman comes in today for scheduled Synvisc 1 injection of both knees.  She has known osteoarthritis both knees.  She has had good relief with the prior supplemental injections.  No knee new injury.  Radiographs right knee show significant patellofemoral arthritic changes and mild narrowing medial joint line.  Left knee shows patellofemoral changes mild to moderate otherwise well-maintained.  On MRI of the left knee did show full-thickness cartilage loss weightbearing aspect medial femoral condyle measuring 1.2 cm transverse by 1.4 cm AP.  Severe patellofemoral changes are seen on the MRI with areas that were denuded.  She has had no new injury.  She is not scheduled for surgery of either knee in the next 6 months. Physical exam: Bilateral knees no abnormal warmth erythema or effusion good range of motion of both knees.  Global tenderness about the left knee. Procedures: Visit Diagnoses:  1. Primary osteoarthritis of left knee   2. Primary osteoarthritis of right knee     Large Joint Inj: bilateral knee on 02/02/2021 4:44 PM Indications: pain Details: 22 G 1.5 in needle, anterolateral approach  Arthrogram: No  Medications (Right): 3 mL lidocaine 1 %; 48 mg Hylan 48 MG/6ML Medications (Left): 3 mL lidocaine 1 %; 48 mg Hylan 48 MG/6ML Outcome: tolerated well, no immediate complications Procedure, treatment alternatives, risks and benefits explained, specific risks discussed. Consent was given by the patient. Immediately prior to procedure a time out was called to verify the correct patient, procedure, equipment, support staff and site/side marked as required. Patient was prepped and draped in the usual sterile fashion.   Plan: We will see her back on an as-needed basis.  She has she has to wait at least 6 months between supplemental injections.   Questions encouraged and

## 2021-02-27 ENCOUNTER — Other Ambulatory Visit: Payer: Self-pay | Admitting: Internal Medicine

## 2021-02-27 DIAGNOSIS — Z1231 Encounter for screening mammogram for malignant neoplasm of breast: Secondary | ICD-10-CM

## 2021-04-03 ENCOUNTER — Other Ambulatory Visit: Payer: Self-pay

## 2021-04-03 ENCOUNTER — Ambulatory Visit
Admission: RE | Admit: 2021-04-03 | Discharge: 2021-04-03 | Disposition: A | Discharge status: Home / Self Care | Payer: BC Managed Care – PPO | Source: Ambulatory Visit | Attending: Internal Medicine | Admitting: Internal Medicine

## 2021-04-03 DIAGNOSIS — Z1231 Encounter for screening mammogram for malignant neoplasm of breast: Secondary | ICD-10-CM | POA: Diagnosis not present

## 2021-04-13 ENCOUNTER — Ambulatory Visit: Payer: Self-pay

## 2021-04-13 ENCOUNTER — Ambulatory Visit (INDEPENDENT_AMBULATORY_CARE_PROVIDER_SITE_OTHER): Payer: BC Managed Care – PPO | Admitting: Physician Assistant

## 2021-04-13 ENCOUNTER — Encounter: Payer: Self-pay | Admitting: Physician Assistant

## 2021-04-13 DIAGNOSIS — M1712 Unilateral primary osteoarthritis, left knee: Secondary | ICD-10-CM | POA: Diagnosis not present

## 2021-04-13 DIAGNOSIS — M25551 Pain in right hip: Secondary | ICD-10-CM

## 2021-04-13 DIAGNOSIS — M7061 Trochanteric bursitis, right hip: Secondary | ICD-10-CM | POA: Diagnosis not present

## 2021-04-13 MED ORDER — LIDOCAINE HCL 1 % IJ SOLN
3.0000 mL | INTRAMUSCULAR | Status: AC | PRN
Start: 2021-04-13 — End: 2021-04-13
  Administered 2021-04-13: 3 mL

## 2021-04-13 MED ORDER — METHYLPREDNISOLONE ACETATE 40 MG/ML IJ SUSP
40.0000 mg | INTRAMUSCULAR | Status: AC | PRN
  Administered 2021-04-13: 40 mg via INTRA_ARTICULAR

## 2021-04-13 NOTE — Progress Notes (Signed)
Office Visit Note   Patient: Carol Newman           Date of Birth: [birth year]           MRN: [id number] Visit Date: 04/13/2021              Requested by: Marden Noble, MD [address] [place],  [place] [address] PCP: Marden Noble, MD   Assessment & Plan: Visit Diagnoses:  1. Trochanteric bursitis, right hip   2. Primary osteoarthritis of left knee     Plan: She shown IT band stretching exercises.  We will see her back in just 1 month to see how she is done with the injections.  Questions were encouraged and answered at length today.  Follow-Up Instructions: Return in about 4 weeks (around 05/11/2021).   Orders:  Orders Placed This Encounter  Procedures   Large Joint Inj: L knee   Large Joint Inj   XR HIP UNILAT W OR W/O PELVIS 2-3 VIEWS RIGHT   No orders of the defined types were placed in this encounter.     Procedures: Large Joint Inj: L knee on 04/13/2021 2:54 PM Indications: pain Details: 22 G 1.5 in needle, anterolateral approach  Arthrogram: No  Medications: 3 mL lidocaine 1 %; 40 mg methylPREDNISolone acetate 40 MG/ML Outcome: tolerated well, no immediate complications Procedure, treatment alternatives, risks and benefits explained, specific risks discussed. Consent was given by the patient. Immediately prior to procedure a time out was called to verify the correct patient, procedure, equipment, support staff and site/side marked as required. Patient was prepped and draped in the usual sterile fashion.    Large Joint Inj on 04/13/2021 2:55 PM Indications: pain Details: 22 G 1.5 in needle, lateral approach  Arthrogram: No  Medications: 3 mL lidocaine 1 %; 40 mg methylPREDNISolone acetate 40 MG/ML Outcome: tolerated well, no immediate complications Procedure, treatment alternatives, risks and benefits explained, specific risks discussed. Consent was given by the patient. Immediately prior to procedure a time out was called to verify the  correct patient, procedure, equipment, support staff and site/side marked as required. Patient was prepped and draped in the usual sterile fashion.      Clinical Data: No additional findings.   Subjective: Chief Complaint  Patient presents with   Left Knee - Pain   Right Hip - Pain    HPI Carol Newman comes in today follow-up status post supplemental injections both knees 02/02/2021.  States that these helped for about 30 days now.  She states her left knee is giving way on her.  She is wanting repeat cortisone injection left knee.  She is also having some hip pain that she states been ongoing for some time no known injury.  Mostly has pain lateral aspect of the right hip when lying on the hip.  She is asking about an injection for this.  She does take occasional hip pain in the groin region.  No radicular symptoms down the leg. Review of Systems Negative for fevers, chills or recent vaccines.  Objective: Vital Signs: There were no vitals taken for this visit.  Physical Exam Constitutional:      Appearance: She is not ill-appearing or diaphoretic.  Pulmonary:     Effort: Pulmonary effort is normal.  Neurological:     Mental Status: She is alert and oriented to person, place, and time.  Psychiatric:        Mood and Affect: Mood normal.    Ortho Exam  Left knee overall good range of motion.  No abnormal warmth or erythema. Bilateral hips good range of motion of both hips extreme external/internal rotation right hip causes discomfort.  She has maximal tenderness over the right greater trochanteric region.  No tenderness over the left greater trochanteric region.  Ambulates without any assistive device. Specialty Comments:  No specialty comments available.  Imaging: XR HIP UNILAT W OR W/O PELVIS 2-3 VIEWS RIGHT  Result Date: 04/13/2021 AP pelvis lateral view of the right hip: Bilateral hips well located.  Hip joint space well-maintained bilaterally.  Left hip with periarticular  spurs.  There is impingement type osteophyte off the right superior acetabulum region.  Otherwise no bony abnormalities or acute fractures.    PMFS History: There are no problems to display for this patient.  Past Medical History:  Diagnosis Date   Diverticulitis     History reviewed. No pertinent family history.  Past Surgical History:  Procedure Laterality Date   BREAST EXCISIONAL BIOPSY Left    Social History   Occupational History   Not on file  Tobacco Use   Smoking status: Never   Smokeless tobacco: Never  Substance and Sexual Activity   Alcohol use: Never   Drug use: Never   Sexual activity: Not on file

## 2021-04-20 DIAGNOSIS — E2839 Other primary ovarian failure: Secondary | ICD-10-CM | POA: Diagnosis not present

## 2021-04-20 DIAGNOSIS — M797 Fibromyalgia: Secondary | ICD-10-CM | POA: Diagnosis not present

## 2021-04-20 DIAGNOSIS — G43009 Migraine without aura, not intractable, without status migrainosus: Secondary | ICD-10-CM | POA: Diagnosis not present

## 2021-04-20 DIAGNOSIS — G894 Chronic pain syndrome: Secondary | ICD-10-CM | POA: Diagnosis not present

## 2021-04-25 IMAGING — MG DIGITAL SCREENING BILAT W/ TOMO W/ CAD
8 series · 8 of 24 positions shown · non-contrast
Comparison: Previous exam(s).

CLINICAL DATA: Screening.

EXAM:
DIGITAL SCREENING BILATERAL MAMMOGRAM WITH TOMO AND CAD

[L MLO synth-2D]
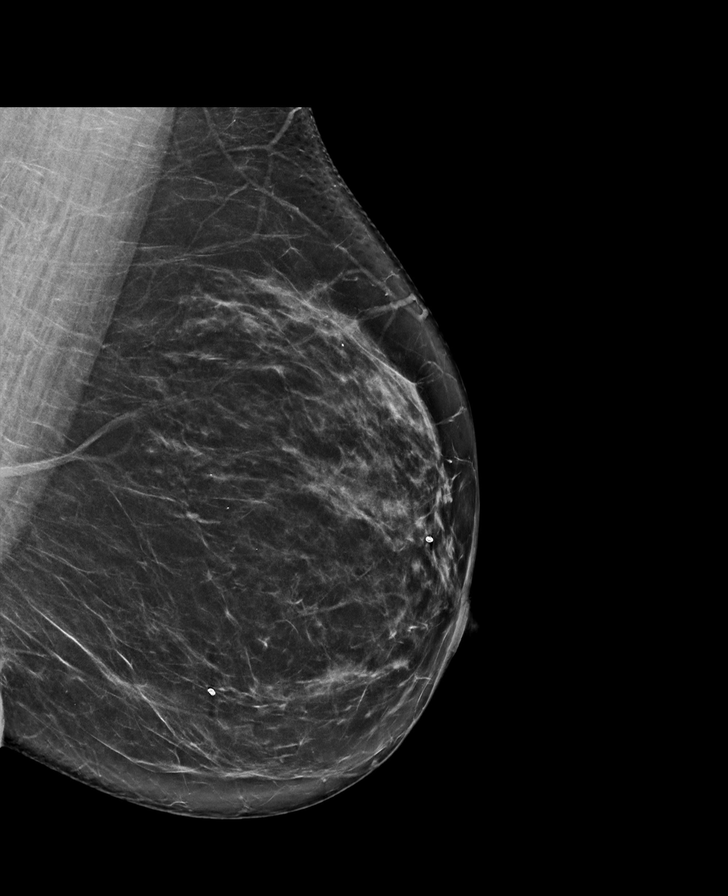

[L CC synth-2D]
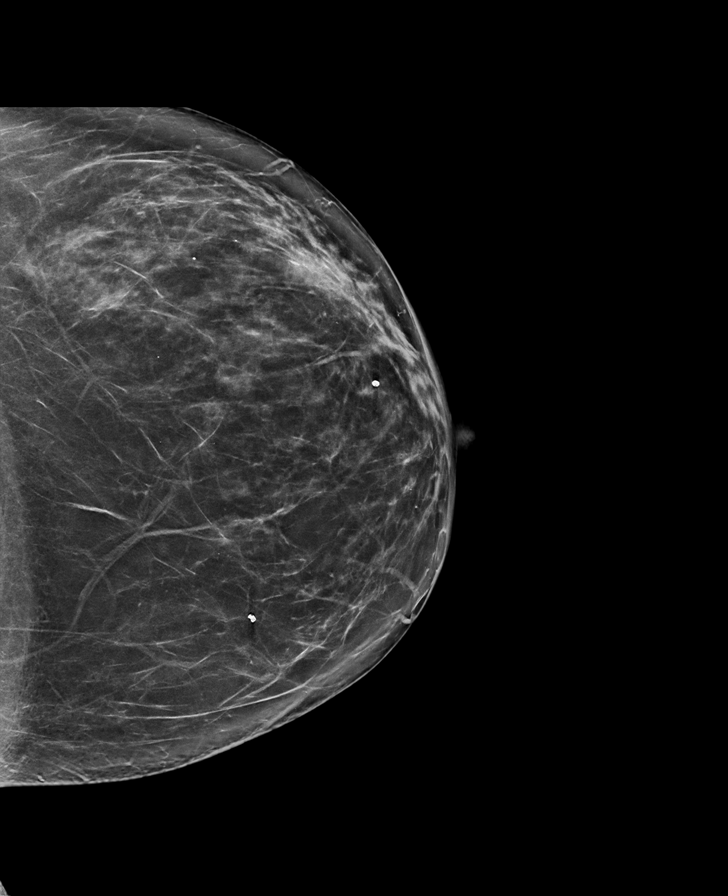

[R MLO synth-2D]
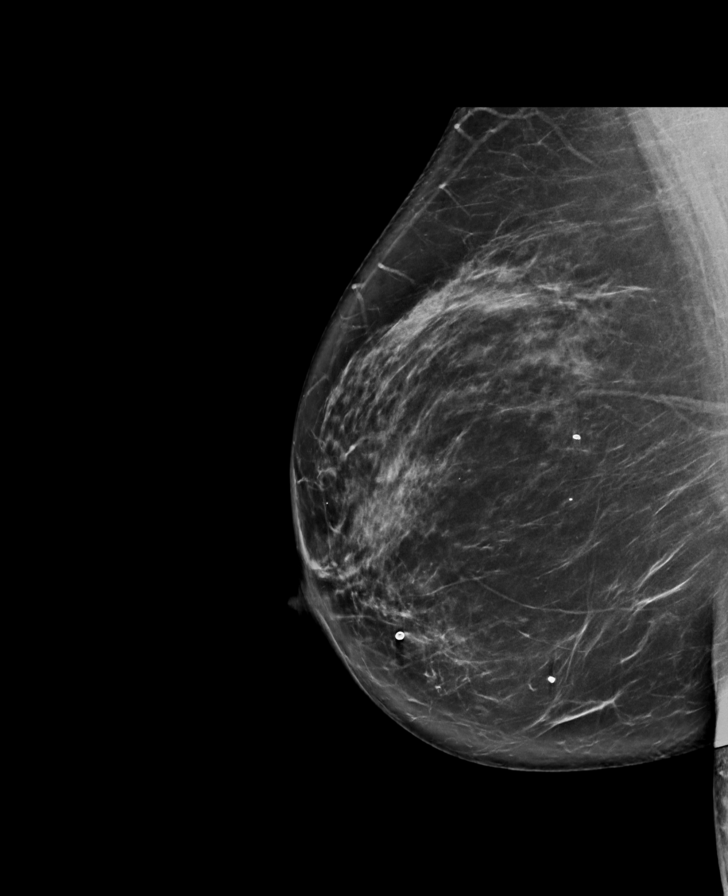

[R CC synth-2D]
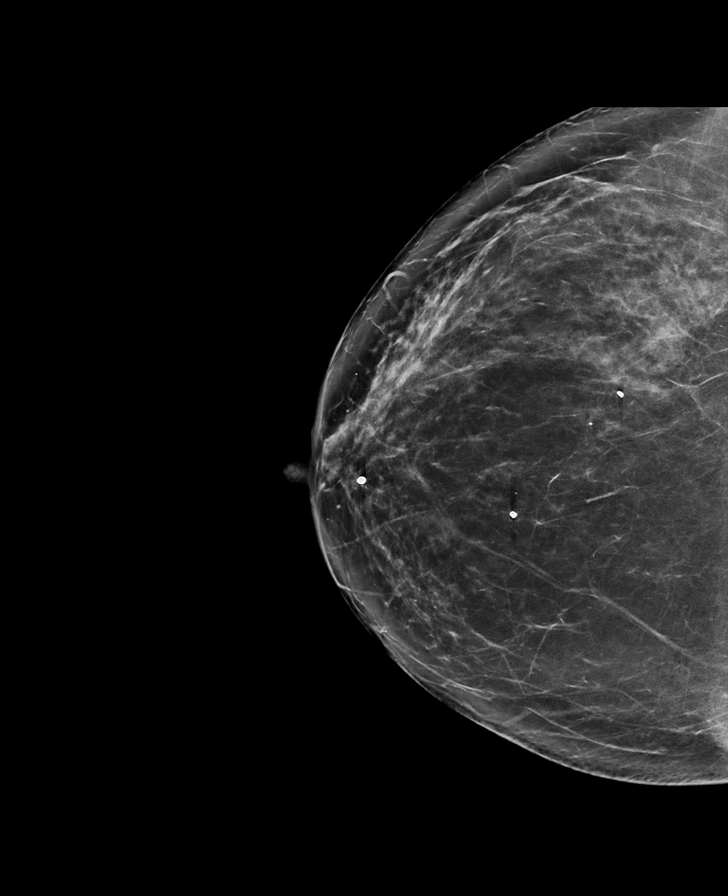

[L MLO tomo · tomo slice 43/86.0]
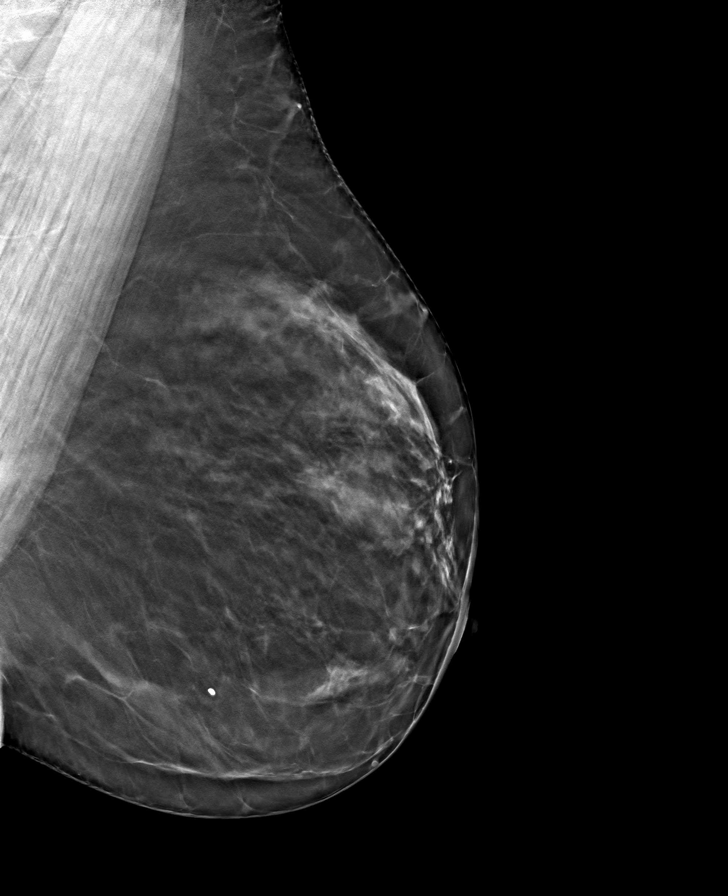

[L CC tomo · tomo slice 41/80.0]
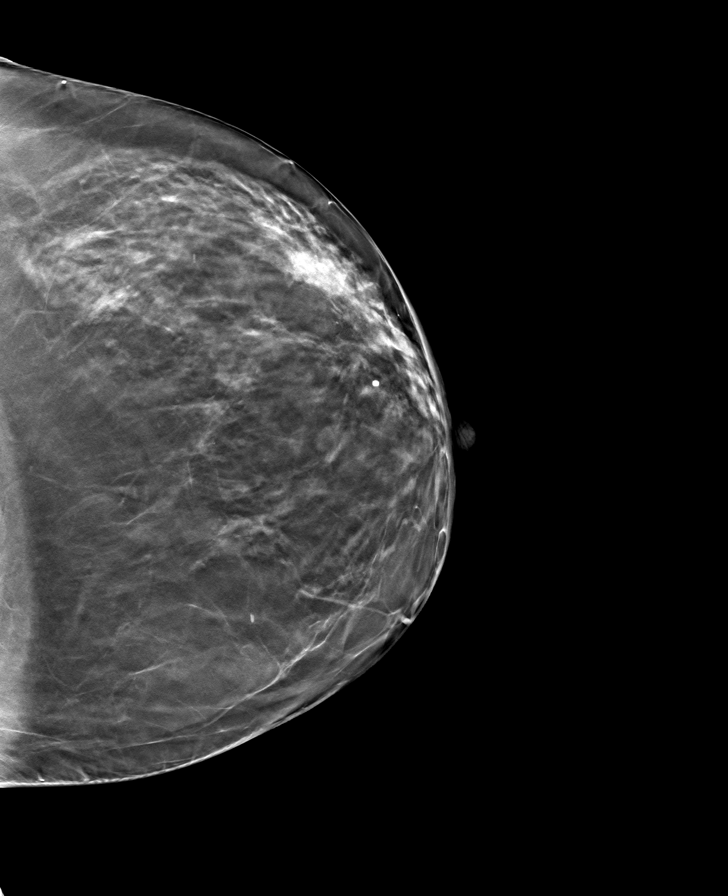

[R MLO tomo · tomo slice 45/88.0]
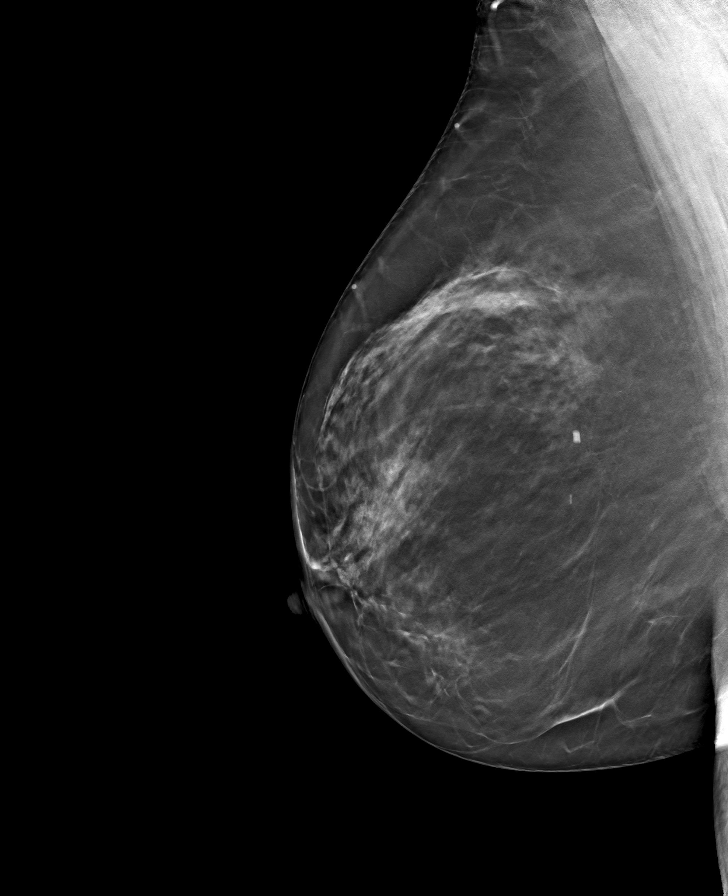

[R CC tomo · tomo slice 41/81.0]
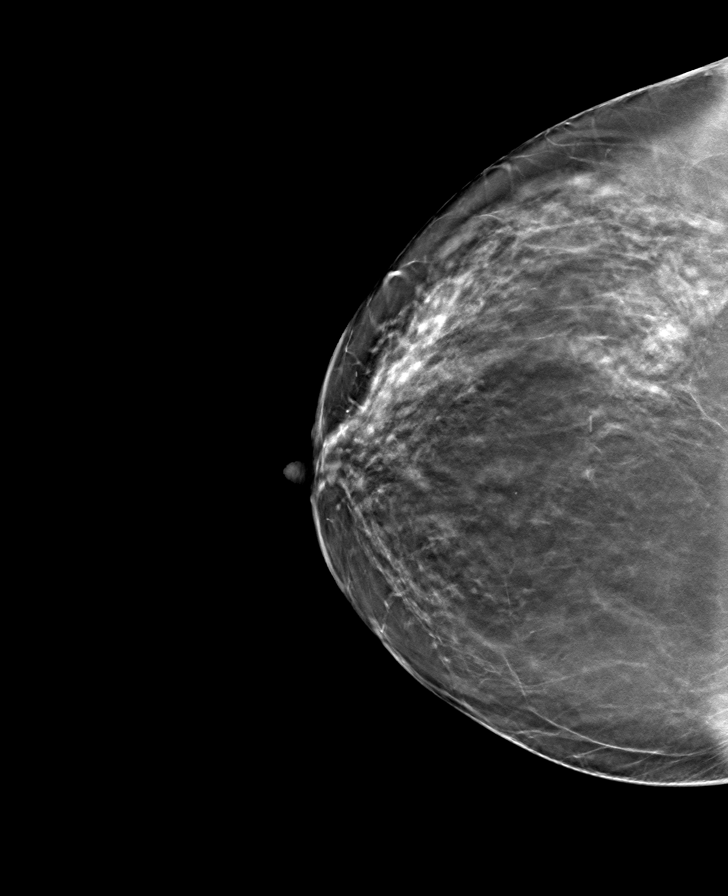

[8 of 24 positions shown; findings below may reference images not displayed]

ACR Breast Density Category c: The breast tissue is heterogeneously
dense, which may obscure small masses.
FINDINGS: There are no findings suspicious for malignancy. Images were
processed with CAD.
IMPRESSION: No mammographic evidence of malignancy. A result letter of this
screening mammogram will be mailed directly to the patient.

RECOMMENDATION:
Screening mammogram in one year. (Code:FT-U-LHB)

BI-RADS CATEGORY  1: Negative.

## 2021-05-04 DIAGNOSIS — E2839 Other primary ovarian failure: Secondary | ICD-10-CM | POA: Diagnosis not present

## 2021-05-04 DIAGNOSIS — E538 Deficiency of other specified B group vitamins: Secondary | ICD-10-CM | POA: Diagnosis not present

## 2021-05-04 DIAGNOSIS — N9089 Other specified noninflammatory disorders of vulva and perineum: Secondary | ICD-10-CM | POA: Diagnosis not present

## 2021-05-04 DIAGNOSIS — G894 Chronic pain syndrome: Secondary | ICD-10-CM | POA: Diagnosis not present

## 2021-05-04 DIAGNOSIS — G43009 Migraine without aura, not intractable, without status migrainosus: Secondary | ICD-10-CM | POA: Diagnosis not present

## 2021-05-04 DIAGNOSIS — Z79899 Other long term (current) drug therapy: Secondary | ICD-10-CM | POA: Diagnosis not present

## 2021-05-04 DIAGNOSIS — E7800 Pure hypercholesterolemia, unspecified: Secondary | ICD-10-CM | POA: Diagnosis not present

## 2021-05-04 DIAGNOSIS — E559 Vitamin D deficiency, unspecified: Secondary | ICD-10-CM | POA: Diagnosis not present

## 2021-05-04 DIAGNOSIS — M797 Fibromyalgia: Secondary | ICD-10-CM | POA: Diagnosis not present

## 2021-05-04 DIAGNOSIS — Z0000 Encounter for general adult medical examination without abnormal findings: Secondary | ICD-10-CM | POA: Diagnosis not present

## 2021-05-11 ENCOUNTER — Encounter: Payer: Self-pay | Admitting: Physician Assistant

## 2021-05-11 ENCOUNTER — Ambulatory Visit (INDEPENDENT_AMBULATORY_CARE_PROVIDER_SITE_OTHER): Payer: BC Managed Care – PPO | Admitting: Physician Assistant

## 2021-05-11 ENCOUNTER — Ambulatory Visit: Payer: Self-pay

## 2021-05-11 ENCOUNTER — Other Ambulatory Visit: Payer: Self-pay

## 2021-05-11 ENCOUNTER — Telehealth: Payer: Self-pay

## 2021-05-11 DIAGNOSIS — M7741 Metatarsalgia, right foot: Secondary | ICD-10-CM

## 2021-05-11 DIAGNOSIS — M7742 Metatarsalgia, left foot: Secondary | ICD-10-CM | POA: Diagnosis not present

## 2021-05-11 NOTE — Telephone Encounter (Signed)
Please submit for Bilateral knee gel injection-Gil pt  Due for the next inj at the end of Jan

## 2021-05-11 NOTE — Telephone Encounter (Signed)
Noted. Will submit in Jan.2023 due to last injection given on 02/02/2021.

## 2021-05-11 NOTE — Progress Notes (Signed)
   Office Visit Note   Patient: Carol Newman           Date of Birth: 08-20-1960           MRN: [id number] Visit Date: 05/11/2021              Requested by: Marden Noble, MD [address] [place],  [place] [address] PCP: Marden Noble, MD   Assessment & Plan: Visit Diagnoses:  1. Metatarsalgia of left foot   2. Metatarsalgia of right foot     Plan:  She is given a pants for both shoes discussed plate proper placement.  She will apply Voltaren gel over the metatarsal head region 2 g up to 4 times daily.  Questions were encouraged and answered at length.  Follow-up as needed   Follow-Up Instructions: Return if symptoms worsen or fail to improve.   Orders:  Orders Placed This Encounter  Procedures   XR Foot Complete Left   XR Foot Complete Right   No orders of the defined types were placed in this encounter.     Procedures: No procedures performed   Clinical Data: No additional findings.   Subjective: Chief Complaint  Patient presents with   Right Foot - Pain   Left Foot - Pain    HPI Carol Newman comes in today for follow-up status post right hip trochanteric injection left knee injection.  She states the injections helped greatly with her hip and her knee.  Today she has pain in both feet began after walking 3 miles.  She does note that she wears orthotics in her shoes.  Pains in both feet left greater than right.  She has had no new injury.  No numbness tingling in either foot. Review of Systems Negative for fevers chills.  Objective: Vital Signs: There were no vitals taken for this visit.  Physical Exam General well-developed well-nourished female in no acute distress Ortho Exam Bilateral feet no rashes skin lesions ulcerations.  Sensation grossly intact.  She has tenderness over the second and thirdmetatarsal heads bilaterally. Remainder of feet are nontender.  Specialty Comments:  No specialty comments available.  Imaging: XR Foot  Complete Left  Result Date: 05/11/2021 Left foot 3 views: Retained hardware from prior bunionectomy.  Morton's type foot with the second third metatarsal heads being longer than the first.  No acute fractures bony abnormalities or subluxations throughout the foot.  XR Foot Complete Right  Result Date: 05/11/2021 Right foot 3 views: No acute fractures.  No subluxations dislocations.  Os peroneus present.  Morton's type foot with the second third metatarsals being longer than the first.    PMFS History: There are no problems to display for this patient.  Past Medical History:  Diagnosis Date   Diverticulitis     History reviewed. No pertinent family history.  Past Surgical History:  Procedure Laterality Date   BREAST EXCISIONAL BIOPSY Left    Social History   Occupational History   Not on file  Tobacco Use   Smoking status: Never   Smokeless tobacco: Never  Substance and Sexual Activity   Alcohol use: Never   Drug use: Never   Sexual activity: Not on file

## 2021-06-02 DIAGNOSIS — R309 Painful micturition, unspecified: Secondary | ICD-10-CM | POA: Diagnosis not present

## 2021-06-02 DIAGNOSIS — E2839 Other primary ovarian failure: Secondary | ICD-10-CM | POA: Diagnosis not present

## 2021-06-02 DIAGNOSIS — M797 Fibromyalgia: Secondary | ICD-10-CM | POA: Diagnosis not present

## 2021-06-02 DIAGNOSIS — G894 Chronic pain syndrome: Secondary | ICD-10-CM | POA: Diagnosis not present

## 2021-07-05 ENCOUNTER — Encounter: Payer: Self-pay | Admitting: Physician Assistant

## 2021-07-05 ENCOUNTER — Ambulatory Visit (INDEPENDENT_AMBULATORY_CARE_PROVIDER_SITE_OTHER): Payer: BC Managed Care – PPO | Admitting: Physician Assistant

## 2021-07-05 DIAGNOSIS — M1711 Unilateral primary osteoarthritis, right knee: Secondary | ICD-10-CM

## 2021-07-05 DIAGNOSIS — M1712 Unilateral primary osteoarthritis, left knee: Secondary | ICD-10-CM

## 2021-07-05 DIAGNOSIS — M170 Bilateral primary osteoarthritis of knee: Secondary | ICD-10-CM

## 2021-07-05 NOTE — Progress Notes (Signed)
HPI: Mr. Flasch returns today to discuss getting bilateral knee supplemental injections.  Last injections were 02/02/2021.  She states that the injections are starting to slowly wear off.  Left knee pain is worse than right.  Ranks her pain to be 9 out of 10 pain at worst.  She does note some giving way of the knees at times.  She has had no new injury to either knee.  She does find the supplemental injections are beneficial for her.  Physical exam: General well-developed well-nourished pleasant female in no acute distress  Bilateral knees good range of motion of both knees.  No abnormal warmth erythema.  Significant patellofemoral crepitus bilateral knees with passive range of motion.   Impression: Bilateral knee osteoarthritis  Plan: We will work on getting her approved for supplemental injections for both knees.  Have her follow-up when these are available.  Questions encouraged and answered at length today.

## 2021-07-10 ENCOUNTER — Telehealth: Payer: Self-pay

## 2021-07-10 NOTE — Telephone Encounter (Signed)
Please get auth for bilateral knee gel injections -gil pt 

## 2021-07-12 NOTE — Telephone Encounter (Signed)
Noted  

## 2021-07-14 ENCOUNTER — Telehealth: Payer: Self-pay

## 2021-07-14 NOTE — Telephone Encounter (Signed)
VOB submitted for SynviscOne, bilateral knee BV pending 

## 2021-07-18 ENCOUNTER — Telehealth: Payer: Self-pay

## 2021-07-18 NOTE — Telephone Encounter (Signed)
PA required for SynviscOne, bilateral knee. PA submitted through [employer]. Pending# [id number]

## 2021-07-19 DIAGNOSIS — M797 Fibromyalgia: Secondary | ICD-10-CM | POA: Diagnosis not present

## 2021-07-19 DIAGNOSIS — G894 Chronic pain syndrome: Secondary | ICD-10-CM | POA: Diagnosis not present

## 2021-07-21 ENCOUNTER — Telehealth: Payer: Self-pay

## 2021-07-21 NOTE — Telephone Encounter (Signed)
Received denial letter from [facility] for SynviscOne, bilateral knee. Faxed appeal letter to [facility] for SynviscOne, bilateral knee to [phone].

## 2021-07-26 DIAGNOSIS — R350 Frequency of micturition: Secondary | ICD-10-CM | POA: Diagnosis not present

## 2021-07-26 DIAGNOSIS — Z87442 Personal history of urinary calculi: Secondary | ICD-10-CM | POA: Diagnosis not present

## 2021-07-31 ENCOUNTER — Telehealth: Payer: Self-pay

## 2021-07-31 ENCOUNTER — Encounter: Payer: Self-pay | Admitting: Physician Assistant

## 2021-07-31 ENCOUNTER — Ambulatory Visit (INDEPENDENT_AMBULATORY_CARE_PROVIDER_SITE_OTHER): Payer: BC Managed Care – PPO | Admitting: Physician Assistant

## 2021-07-31 DIAGNOSIS — M25561 Pain in right knee: Secondary | ICD-10-CM | POA: Diagnosis not present

## 2021-07-31 DIAGNOSIS — M170 Bilateral primary osteoarthritis of knee: Secondary | ICD-10-CM

## 2021-07-31 DIAGNOSIS — M1712 Unilateral primary osteoarthritis, left knee: Secondary | ICD-10-CM

## 2021-07-31 DIAGNOSIS — M25562 Pain in left knee: Secondary | ICD-10-CM | POA: Diagnosis not present

## 2021-07-31 DIAGNOSIS — M1711 Unilateral primary osteoarthritis, right knee: Secondary | ICD-10-CM | POA: Diagnosis not present

## 2021-07-31 MED ORDER — METHYLPREDNISOLONE ACETATE 40 MG/ML IJ SUSP
40.0000 mg | INTRAMUSCULAR | Status: AC | PRN
  Administered 2021-07-31: 40 mg via INTRA_ARTICULAR

## 2021-07-31 MED ORDER — LIDOCAINE HCL 1 % IJ SOLN
3.0000 mL | INTRAMUSCULAR | Status: AC | PRN
Start: 2021-07-31 — End: 2021-07-31
  Administered 2021-07-31: 3 mL

## 2021-07-31 MED ORDER — LIDOCAINE HCL 1 % IJ SOLN
3.0000 mL | INTRAMUSCULAR | Status: AC | PRN
  Administered 2021-07-31: 3 mL

## 2021-07-31 MED ORDER — METHYLPREDNISOLONE ACETATE 40 MG/ML IJ SUSP
40.0000 mg | INTRAMUSCULAR | Status: AC | PRN
Start: 2021-07-31 — End: 2021-07-31
  Administered 2021-07-31: 40 mg via INTRA_ARTICULAR

## 2021-07-31 NOTE — Progress Notes (Signed)
° °  Procedure Note  Patient: Carol Newman             Date of Birth: 1961/05/11           MRN: [id number]             Visit Date: 07/31/2021  HPI: Carol Newman comes in today with bilateral knee pain.  She has known osteoarthritis both knees.  She has been denied approval for supplemental injections.  She is requesting injections both knees.  No new injury to either knee.  However she is having increased pain in her knees with activity such as walking.  Review of systems: No fevers chills.  Nondiabetic.  Physical exam: Bilateral knees good range of motion.  Left knee with slight effusion.  No abnormal warmth erythema of either knee.  Procedures: Visit Diagnoses:  1. Primary osteoarthritis of left knee   2. Primary osteoarthritis of right knee     Large Joint Inj: bilateral knee on 07/31/2021 11:04 AM Medications (Right): 3 mL lidocaine 1 %; 40 mg methylPREDNISolone acetate 40 MG/ML Medications (Left): 3 mL lidocaine 1 %; 40 mg methylPREDNISolone acetate 40 MG/ML Aspirate (Left): 2 mL yellow   Plan: She is given a handout on other supplemental injections.  She understands to wait at least 3 months between cortisone injections.  She will let us know if she would like to order the other supplemental injection for her knees.  Questions were encouraged and answered

## 2021-07-31 NOTE — Telephone Encounter (Signed)
Called and left Vm advising patient to CB to schedule for gel injection with [facility] after 08/05/2021.  Approved for SynviscOne, bilateral knee. Buy & Bill Must meet deductible first Patient will be responsible for 20% OOP. No Co-pay PA Approval# [id number] Valid 07/18/2021- 01/14/2022

## 2021-07-31 NOTE — Telephone Encounter (Signed)
Scheduled to see gil today @ 10:15

## 2021-07-31 NOTE — Telephone Encounter (Signed)
Patient called and states that she has been having fluid built up on her left and right knee. She states that she would like to be evaluated and would like a call back on when this could be.   [phone] (home) [phone] (work)

## 2021-09-01 DIAGNOSIS — R350 Frequency of micturition: Secondary | ICD-10-CM | POA: Diagnosis not present

## 2021-09-01 DIAGNOSIS — N281 Cyst of kidney, acquired: Secondary | ICD-10-CM | POA: Diagnosis not present

## 2021-09-04 ENCOUNTER — Other Ambulatory Visit: Payer: Self-pay

## 2021-09-05 DIAGNOSIS — Z0130 Encounter for examination of blood pressure without abnormal findings: Secondary | ICD-10-CM | POA: Diagnosis not present

## 2021-09-06 ENCOUNTER — Other Ambulatory Visit: Payer: Self-pay

## 2021-10-04 ENCOUNTER — Encounter: Payer: Self-pay | Admitting: Physician Assistant

## 2021-10-04 ENCOUNTER — Ambulatory Visit (INDEPENDENT_AMBULATORY_CARE_PROVIDER_SITE_OTHER): Payer: BC Managed Care – PPO | Admitting: Physician Assistant

## 2021-10-04 DIAGNOSIS — G894 Chronic pain syndrome: Secondary | ICD-10-CM | POA: Diagnosis not present

## 2021-10-04 DIAGNOSIS — M1711 Unilateral primary osteoarthritis, right knee: Secondary | ICD-10-CM | POA: Diagnosis not present

## 2021-10-04 DIAGNOSIS — M170 Bilateral primary osteoarthritis of knee: Secondary | ICD-10-CM

## 2021-10-04 DIAGNOSIS — M1712 Unilateral primary osteoarthritis, left knee: Secondary | ICD-10-CM

## 2021-10-04 DIAGNOSIS — M797 Fibromyalgia: Secondary | ICD-10-CM | POA: Diagnosis not present

## 2021-10-04 MED ORDER — HYLAN G-F 20 48 MG/6ML IX SOSY
48.0000 mg | PREFILLED_SYRINGE | INTRA_ARTICULAR | Status: AC | PRN
  Administered 2021-10-04: 48 mg via INTRA_ARTICULAR

## 2021-10-04 NOTE — Progress Notes (Signed)
? ?  Procedure Note ? ?Patient: Carol Newman             ?Date of Birth: August 04, 1960           ?MRN: [id number]             ?Visit Date: 10/04/2021 ?HPI: Carol Newman comes in today for scheduled bilateral Synvisc 1 injections.  Last injections were 02/02/2021.  She states the cortisone injections that she last received on 07/31/2021 did help some but were short-lived.  She has known osteoarthritis of both knees.  She has no upcoming surgery on either knee in the next 6 months. ? ?Physical exam: ?Bilateral knees: Good range of motion no abnormal warmth erythema or effusion ? ?Procedures: ?Visit Diagnoses:  ?1. Primary osteoarthritis of left knee   ?2. Primary osteoarthritis of right knee   ? ? ?Large Joint Inj: bilateral knee on 10/04/2021 2:47 PM ?Indications: pain ?Details: 22 G 1.5 in needle, superolateral approach ? ?Arthrogram: No ? ?Medications (Right): 48 mg Hylan 48 MG/6ML ?Medications (Left): 48 mg Hylan 48 MG/6ML ?Outcome: tolerated well, no immediate complications ?Procedure, treatment alternatives, risks and benefits explained, specific risks discussed. Consent was given by the patient. Immediately prior to procedure a time out was called to verify the correct patient, procedure, equipment, support staff and site/side marked as required. Patient was prepped and draped in the usual sterile fashion.  ? ? ?Plan: Both knees were wrapped with Ace bandages.  She will remove these this evening before returning to bed.  She understands wait least 6 months between supplemental injections.  Questions were encouraged and answered ? ? ?

## 2021-10-12 ENCOUNTER — Ambulatory Visit: Payer: BC Managed Care – PPO | Admitting: Physician Assistant

## 2021-10-18 ENCOUNTER — Other Ambulatory Visit: Payer: Self-pay

## 2021-10-26 ENCOUNTER — Other Ambulatory Visit: Payer: Self-pay

## 2021-12-21 ENCOUNTER — Ambulatory Visit (INDEPENDENT_AMBULATORY_CARE_PROVIDER_SITE_OTHER): Payer: BC Managed Care – PPO | Admitting: Physician Assistant

## 2021-12-21 ENCOUNTER — Encounter: Payer: Self-pay | Admitting: Physician Assistant

## 2021-12-21 DIAGNOSIS — M7061 Trochanteric bursitis, right hip: Secondary | ICD-10-CM | POA: Diagnosis not present

## 2021-12-21 MED ORDER — LIDOCAINE HCL 1 % IJ SOLN
3.0000 mL | INTRAMUSCULAR | Status: AC | PRN
  Administered 2021-12-21: 3 mL

## 2021-12-21 MED ORDER — METHYLPREDNISOLONE ACETATE 40 MG/ML IJ SUSP
40.0000 mg | INTRAMUSCULAR | Status: AC | PRN
  Administered 2021-12-21: 40 mg via INTRA_ARTICULAR

## 2022-01-03 DIAGNOSIS — F419 Anxiety disorder, unspecified: Secondary | ICD-10-CM | POA: Diagnosis not present

## 2022-01-03 DIAGNOSIS — G894 Chronic pain syndrome: Secondary | ICD-10-CM | POA: Diagnosis not present

## 2022-01-03 DIAGNOSIS — R1032 Left lower quadrant pain: Secondary | ICD-10-CM | POA: Diagnosis not present

## 2022-01-03 DIAGNOSIS — M797 Fibromyalgia: Secondary | ICD-10-CM | POA: Diagnosis not present

## 2022-01-30 DIAGNOSIS — L821 Other seborrheic keratosis: Secondary | ICD-10-CM | POA: Diagnosis not present

## 2022-01-30 DIAGNOSIS — D225 Melanocytic nevi of trunk: Secondary | ICD-10-CM | POA: Diagnosis not present

## 2022-01-30 DIAGNOSIS — L578 Other skin changes due to chronic exposure to nonionizing radiation: Secondary | ICD-10-CM | POA: Diagnosis not present

## 2022-01-30 DIAGNOSIS — L814 Other melanin hyperpigmentation: Secondary | ICD-10-CM | POA: Diagnosis not present

## 2022-02-27 DIAGNOSIS — J3489 Other specified disorders of nose and nasal sinuses: Secondary | ICD-10-CM | POA: Diagnosis not present

## 2022-02-27 DIAGNOSIS — R0989 Other specified symptoms and signs involving the circulatory and respiratory systems: Secondary | ICD-10-CM | POA: Diagnosis not present

## 2022-02-27 DIAGNOSIS — Z03818 Encounter for observation for suspected exposure to other biological agents ruled out: Secondary | ICD-10-CM | POA: Diagnosis not present

## 2022-02-27 DIAGNOSIS — R058 Other specified cough: Secondary | ICD-10-CM | POA: Diagnosis not present

## 2022-03-08 ENCOUNTER — Ambulatory Visit: Payer: BC Managed Care – PPO | Admitting: Physician Assistant

## 2022-03-30 DIAGNOSIS — R1032 Left lower quadrant pain: Secondary | ICD-10-CM | POA: Diagnosis not present

## 2022-03-30 DIAGNOSIS — G894 Chronic pain syndrome: Secondary | ICD-10-CM | POA: Diagnosis not present

## 2022-03-30 DIAGNOSIS — F419 Anxiety disorder, unspecified: Secondary | ICD-10-CM | POA: Diagnosis not present

## 2022-03-30 DIAGNOSIS — M797 Fibromyalgia: Secondary | ICD-10-CM | POA: Diagnosis not present

## 2022-04-05 ENCOUNTER — Telehealth: Payer: Self-pay

## 2022-04-05 ENCOUNTER — Encounter: Payer: Self-pay | Admitting: Physician Assistant

## 2022-04-05 ENCOUNTER — Ambulatory Visit (INDEPENDENT_AMBULATORY_CARE_PROVIDER_SITE_OTHER): Payer: BC Managed Care – PPO | Admitting: Physician Assistant

## 2022-04-05 DIAGNOSIS — M170 Bilateral primary osteoarthritis of knee: Secondary | ICD-10-CM | POA: Diagnosis not present

## 2022-04-05 DIAGNOSIS — M7061 Trochanteric bursitis, right hip: Secondary | ICD-10-CM

## 2022-04-05 DIAGNOSIS — M1712 Unilateral primary osteoarthritis, left knee: Secondary | ICD-10-CM

## 2022-04-05 DIAGNOSIS — M1711 Unilateral primary osteoarthritis, right knee: Secondary | ICD-10-CM | POA: Diagnosis not present

## 2022-04-05 MED ORDER — METHYLPREDNISOLONE ACETATE 40 MG/ML IJ SUSP
40.0000 mg | INTRAMUSCULAR | Status: AC | PRN
Start: 2022-04-05 — End: 2022-04-05
  Administered 2022-04-05: 40 mg via INTRA_ARTICULAR

## 2022-04-05 MED ORDER — LIDOCAINE HCL 1 % IJ SOLN
3.0000 mL | INTRAMUSCULAR | Status: AC | PRN
  Administered 2022-04-05: 3 mL

## 2022-04-05 NOTE — Telephone Encounter (Signed)
Please get auth for repeat bilateral gel injection

## 2022-04-05 NOTE — Progress Notes (Addendum)
   Procedure Note  Patient: Carol Newman             Date of Birth: 1961-05-31           MRN: [id number]             Visit Date: 04/05/2022 Mrs. Chianese is well-known to Dr. Delilah Shan service comes in today due to right hip trochanteric pain.  Last injection was given on 12/21/2021.  She has had no new injury to the hip.  She has been placing Biofreeze on her hip.  She does have bilateral knee pain right greater than left.  She sleeps with a pillow between her knees.  Has known osteoarthritis of both knees.  Review of systems: Negative for fevers chills.  Physical exam: Bilateral hips good range of motion.  Tenderness over the right greater than left trochanteric region. Bilateral knees: Good range of motion both knees no abnormal warmth erythema or effusion. Procedures: Visit Diagnoses:  1. Trochanteric bursitis, right hip   2. Primary osteoarthritis of left knee   3. Primary osteoarthritis of right knee     Large Joint Inj: R greater trochanter on 04/05/2022 1:15 PM Indications: pain Details: 22 G 1.5 in needle, lateral approach  Arthrogram: No  Medications: 3 mL lidocaine 1 %; 40 mg methylPREDNISolone acetate 40 MG/ML Outcome: tolerated well, no immediate complications Procedure, treatment alternatives, risks and benefits explained, specific risks discussed. Consent was given by the patient. Immediately prior to procedure a time out was called to verify the correct patient, procedure, equipment, support staff and site/side marked as required. Patient was prepped and draped in the usual sterile fashion.     Plan: She will follow-up with [place] as needed she understands to wait at least 3 months between cortisone injections right hip.  Addendum: Spoke with patient the day of the trochanteric injection about reordering gel injections.  She noted that she gets approximately 75% relief of knee pain bilaterally with the gel injections in the past.  She notes that she usually gets 5 months of relief  from her pain in her knees.  She feels as if it helps with mechanical symptoms particularly giving way and catching in both knees.  She is unable to take NSAIDs.  She does take narcotics due to chronic low back pain.  She has tried cortisone injections in her knees in the past but these given her very short relief.  She has known osteoarthritis of both knees.  And she has no planned surgery on either knee for the next 6 months.

## 2022-04-10 NOTE — Telephone Encounter (Signed)
VOB submitted for SynviscOne, bilateral knee  

## 2022-04-18 ENCOUNTER — Telehealth: Payer: Self-pay

## 2022-04-18 NOTE — Telephone Encounter (Signed)
PA submitted online through [employer] for SynviscOne, bilateral knee. Pending# [id number]

## 2022-04-20 ENCOUNTER — Telehealth: Payer: Self-pay

## 2022-04-20 NOTE — Telephone Encounter (Signed)
FYI- Patient was denied through [facility] for SynviscOne, bilateral knee.  Stated that patient will be approved when she has had less pain, fewer medications,and significant improvement in activities since last knee injection.  Please advise on next option. Thank you.

## 2022-04-23 NOTE — Telephone Encounter (Signed)
Nothing was documented about results of gel injection so insurance denied them

## 2022-04-24 DIAGNOSIS — H40003 Preglaucoma, unspecified, bilateral: Secondary | ICD-10-CM | POA: Diagnosis not present

## 2022-05-17 ENCOUNTER — Telehealth: Payer: Self-pay | Admitting: Orthopedic Surgery

## 2022-05-17 NOTE — Telephone Encounter (Signed)
Ms. Carol Newman called in to check on the status of her knee injections.  She stated that Autumn H. Was going to work on getting her office notes amended to provide additional information for [employer].  Please call her to discuss at [phone].

## 2022-05-17 NOTE — Telephone Encounter (Signed)
Faxed addendum appeal letter to [facility] for SynviscOne, bilateral knee at [phone].

## 2022-05-22 ENCOUNTER — Other Ambulatory Visit: Payer: Self-pay

## 2022-05-22 DIAGNOSIS — M1712 Unilateral primary osteoarthritis, left knee: Secondary | ICD-10-CM

## 2022-05-22 DIAGNOSIS — M1711 Unilateral primary osteoarthritis, right knee: Secondary | ICD-10-CM

## 2022-05-28 ENCOUNTER — Encounter: Payer: Self-pay | Admitting: Physician Assistant

## 2022-05-28 ENCOUNTER — Ambulatory Visit (INDEPENDENT_AMBULATORY_CARE_PROVIDER_SITE_OTHER): Payer: BC Managed Care – PPO

## 2022-05-28 ENCOUNTER — Ambulatory Visit (INDEPENDENT_AMBULATORY_CARE_PROVIDER_SITE_OTHER): Payer: BC Managed Care – PPO | Admitting: Physician Assistant

## 2022-05-28 DIAGNOSIS — M1711 Unilateral primary osteoarthritis, right knee: Secondary | ICD-10-CM | POA: Diagnosis not present

## 2022-05-28 DIAGNOSIS — M1712 Unilateral primary osteoarthritis, left knee: Secondary | ICD-10-CM

## 2022-05-28 DIAGNOSIS — M25571 Pain in right ankle and joints of right foot: Secondary | ICD-10-CM | POA: Diagnosis not present

## 2022-05-28 DIAGNOSIS — M170 Bilateral primary osteoarthritis of knee: Secondary | ICD-10-CM | POA: Diagnosis not present

## 2022-05-28 MED ORDER — LIDOCAINE HCL 1 % IJ SOLN
3.0000 mL | INTRAMUSCULAR | Status: AC | PRN
Start: 2022-05-28 — End: 2022-05-28
  Administered 2022-05-28: 3 mL

## 2022-05-28 MED ORDER — LIDOCAINE HCL 1 % IJ SOLN
3.0000 mL | INTRAMUSCULAR | Status: AC | PRN
  Administered 2022-05-28: 3 mL

## 2022-05-28 MED ORDER — HYLAN G-F 20 48 MG/6ML IX SOSY
48.0000 mg | PREFILLED_SYRINGE | INTRA_ARTICULAR | Status: AC | PRN
  Administered 2022-05-28: 48 mg via INTRA_ARTICULAR

## 2022-05-28 NOTE — Progress Notes (Signed)
   Procedure Note  Patient: Carol Newman             Date of Birth: 1960/11/19           MRN: [id number]             Visit Date: 05/28/2022 HPI: [name] comes in today schedule bilateral knee Synvisc injections.  She also reports that she fell 3 weeks ago and injured her right ankle and has had pain lateral aspect of ankle since then.  Notes swelling of the ankle. In regards to her knees she has tried cortisone injections in the past and exercise.  She has had very little relief with cortisone injections in the past.  She has known osteoarthritis of both knees.  She has no known surgery on either knee in the next 6 months.  Review of systems: See HPI otherwise negative or noncontributory.  Physical exam: General well-developed well-nourished female no acute distress walks with a slight limp without assistive device. Bilateral knees: Good range of motion of both knees no abnormal warmth erythema or effusion.  Right ankle: Slight edema of the right ankle compared to left.  Tenderness over the lateral malleolus and proximal portion of the peroneal tendons.  She has good dorsiflexion plantarflexion ankle.  No tenderness over the medial aspect of the ankle.  There is no rashes skin lesions ulcerations or ecchymosis about the ankle.  Right calf supple nontender.  She is able to evert the right foot against resistance and has bilateral 5 strength without pain.  Dorsal pedal pulses 2+.   Radiographs: 3 views right ankle: Talus well located within the ankle mortise no diastases.  No acute fractures or acute findings.  Procedures: Visit Diagnoses:  1. Pain in right ankle and joints of right foot     Large Joint Inj: bilateral knee on 05/28/2022 4:52 PM Indications: pain Details: 22 G 1.5 in needle, anterolateral approach  Arthrogram: No  Medications (Right): 3 mL lidocaine 1 %; 48 mg Hylan G-F 20 48 MG/6ML Medications (Left): 3 mL lidocaine 1 %; 48 mg Hylan G-F 20 48 MG/6ML Outcome: tolerated  well, no immediate complications Procedure, treatment alternatives, risks and benefits explained, specific risks discussed. Consent was given by the patient. Immediately prior to procedure a time out was called to verify the correct patient, procedure, equipment, support staff and site/side marked as required. Patient was prepped and draped in the usual sterile fashion.    Plan: She will follow-up with [place] as needed.  She knows to wait at least 6 months between supplemental injections.  In regards to the ankle she is placed in an ASO brace she will wear this at all times when up ambulating for the next 4 weeks and then after that where it for an additional 2 weeks whenever she is out of the home.  She will follow-up with [place] if she has any mechanical symptoms of the ankle and pain persist or becomes worse.  Questions were encouraged and

## 2022-06-04 ENCOUNTER — Other Ambulatory Visit: Payer: Self-pay | Admitting: Internal Medicine

## 2022-06-04 ENCOUNTER — Ambulatory Visit
Admission: RE | Admit: 2022-06-04 | Discharge: 2022-06-04 | Disposition: A | Discharge status: Home / Self Care | Payer: BC Managed Care – PPO | Source: Ambulatory Visit | Attending: Internal Medicine | Admitting: Internal Medicine

## 2022-06-04 DIAGNOSIS — Z1231 Encounter for screening mammogram for malignant neoplasm of breast: Secondary | ICD-10-CM

## 2022-06-06 ENCOUNTER — Other Ambulatory Visit: Payer: Self-pay | Admitting: Internal Medicine

## 2022-06-06 DIAGNOSIS — M8580 Other specified disorders of bone density and structure, unspecified site: Secondary | ICD-10-CM

## 2022-06-06 DIAGNOSIS — Z79899 Other long term (current) drug therapy: Secondary | ICD-10-CM | POA: Diagnosis not present

## 2022-06-06 DIAGNOSIS — E538 Deficiency of other specified B group vitamins: Secondary | ICD-10-CM | POA: Diagnosis not present

## 2022-06-06 DIAGNOSIS — G894 Chronic pain syndrome: Secondary | ICD-10-CM | POA: Diagnosis not present

## 2022-06-06 DIAGNOSIS — E559 Vitamin D deficiency, unspecified: Secondary | ICD-10-CM | POA: Diagnosis not present

## 2022-06-06 DIAGNOSIS — Z0000 Encounter for general adult medical examination without abnormal findings: Secondary | ICD-10-CM | POA: Diagnosis not present

## 2022-06-06 DIAGNOSIS — E7800 Pure hypercholesterolemia, unspecified: Secondary | ICD-10-CM | POA: Diagnosis not present

## 2022-06-18 ENCOUNTER — Other Ambulatory Visit: Payer: Self-pay | Admitting: Internal Medicine

## 2022-06-18 DIAGNOSIS — Z87891 Personal history of nicotine dependence: Secondary | ICD-10-CM

## 2022-07-31 ENCOUNTER — Other Ambulatory Visit: Payer: BC Managed Care – PPO

## 2022-08-28 ENCOUNTER — Ambulatory Visit
Admission: RE | Admit: 2022-08-28 | Discharge: 2022-08-28 | Disposition: A | Discharge status: Home / Self Care | Payer: No Typology Code available for payment source | Source: Ambulatory Visit | Attending: Internal Medicine | Admitting: Internal Medicine

## 2022-08-28 DIAGNOSIS — M8580 Other specified disorders of bone density and structure, unspecified site: Secondary | ICD-10-CM

## 2022-09-27 ENCOUNTER — Telehealth: Payer: Self-pay | Admitting: Physician Assistant

## 2022-09-27 NOTE — Telephone Encounter (Signed)
I called and talked to the pt. She wanted to see Artis Delay again. Said her right hip is hurting more. Unsure if can have inj due to a injection she had. I scheduled for her to come in and discuss treatment with Artis Delay .

## 2022-09-27 NOTE — Telephone Encounter (Signed)
Patient called in stating she had questions about her current medications she is taking and her steroid injection she is supposed to be getting and would like Autumn H to call her please advise

## 2022-10-04 ENCOUNTER — Other Ambulatory Visit (INDEPENDENT_AMBULATORY_CARE_PROVIDER_SITE_OTHER): Payer: No Typology Code available for payment source

## 2022-10-04 ENCOUNTER — Ambulatory Visit: Payer: No Typology Code available for payment source | Admitting: Physician Assistant

## 2022-10-04 ENCOUNTER — Encounter: Payer: Self-pay | Admitting: Physician Assistant

## 2022-10-04 DIAGNOSIS — M25551 Pain in right hip: Secondary | ICD-10-CM | POA: Diagnosis not present

## 2022-10-04 NOTE — Progress Notes (Signed)
Office Visit Note   Patient: Carol Newman           Date of Birth: [birth year]           MRN: [id number] Visit Date: 10/04/2022              Requested by: Josetta Huddle, MD [address] [place],  [place] [id number] PCP: Josetta Huddle, MD   Assessment & Plan: Visit Diagnoses:  1. Pain of right hip     Plan: Given patient's abdominal pain that started after her recent colonoscopy with biopsy recommend that she follow-up with her gastroenterologist.  Explained to her that typically with hip arthritis she had pain that is in the groin buttocks and sometimes lateral aspect of the hip and down into the knee but not typically up into the lower quadrant of the abdomen.  We can definitely work her hip up as a source of her pain once she has been evaluated by gastroenterology.  She may require an MRI of the right hip to evaluate cartilage versus intra-articular injection.  Questions were encouraged and answered  Follow-Up Instructions: Return if symptoms worsen or fail to improve.   Orders:  Orders Placed This Encounter  Procedures   XR HIP UNILAT W OR W/O PELVIS 2-3 VIEWS RIGHT   No orders of the defined types were placed in this encounter.     Procedures: No procedures performed   Clinical Data: No additional findings.   Subjective: Chief Complaint  Patient presents with   Right Hip - Pain    HPI Mrs. Swaby comes in today with pain about her groin area.  Reports that she underwent a colonoscopy on March 7 and biopsy was performed following the ascending colon.  This is later determined to be " an ulcer" and she was placed on Cipro and Flagyl on March 12.  She is unable to complete the course of any medications due to side effects.  She does report abdominal pain on the right side since the colonoscopy.  She saw a provider who stated she had swollen lymph nodes in the area of her right groin.  She states she has had on and off low-grade fever since the colonoscopy.   She is having no radicular symptoms down the leg.  She has tried ice and rest continues to have pain.  No known injury.  Pain is worse with activity.  Review of Systems See HPI  Objective: Vital Signs: There were no vitals taken for this visit.  Physical Exam Constitutional:      Appearance: She is not ill-appearing or diaphoretic.  Pulmonary:     Effort: Pulmonary effort is normal.  Abdominal:     Tenderness: There is abdominal tenderness.     Comments: Tenderness over the right lower abdominal quadrant region.  Palpable / tender lymph node right inguinal fold region.  Neurological:     Mental Status: She is alert and oriented to person, place, and time.  Psychiatric:        Mood and Affect: Mood normal.     Ortho Exam Bilateral hips good range of motion of both hips.  No pain with logrolling.  Extremes of external and internal rotation of the right hip causes some discomfort.  Tenderness over the right hip trochanteric region minimal.  Specialty Comments:  No specialty comments available.  Imaging: XR HIP UNILAT W OR W/O PELVIS 2-3 VIEWS RIGHT  Result Date: 10/04/2022 AP pelvis lateral view right hip: No  acute fractures.  Bilateral hips well located.  Right hip with slight joint space narrowing and osteophyte off the superior acetabulum.  No bony abnormalities otherwise.    PMFS History: There are no problems to display for this patient.  Past Medical History:  Diagnosis Date   Diverticulitis     History reviewed. No pertinent family history.  Past Surgical History:  Procedure Laterality Date   BREAST EXCISIONAL BIOPSY Left    Social History   Occupational History   Not on file  Tobacco Use   Smoking status: Never   Smokeless tobacco: Never  Substance and Sexual Activity   Alcohol use: Never   Drug use: Never   Sexual activity: Not on file

## 2022-10-09 ENCOUNTER — Other Ambulatory Visit (HOSPITAL_COMMUNITY): Payer: Self-pay | Admitting: Gastroenterology

## 2022-10-09 DIAGNOSIS — K559 Vascular disorder of intestine, unspecified: Secondary | ICD-10-CM

## 2022-10-09 DIAGNOSIS — R1084 Generalized abdominal pain: Secondary | ICD-10-CM

## 2022-10-15 ENCOUNTER — Ambulatory Visit: Payer: No Typology Code available for payment source | Admitting: Physician Assistant

## 2022-10-22 ENCOUNTER — Ambulatory Visit (HOSPITAL_COMMUNITY)
Admission: RE | Admit: 2022-10-22 | Discharge: 2022-10-22 | Disposition: A | Discharge status: Home / Self Care | Payer: No Typology Code available for payment source | Source: Ambulatory Visit | Attending: Gastroenterology | Admitting: Gastroenterology

## 2022-10-22 ENCOUNTER — Encounter (HOSPITAL_COMMUNITY): Payer: Self-pay

## 2022-10-22 DIAGNOSIS — R1084 Generalized abdominal pain: Secondary | ICD-10-CM | POA: Diagnosis present

## 2022-10-22 DIAGNOSIS — K559 Vascular disorder of intestine, unspecified: Secondary | ICD-10-CM | POA: Diagnosis not present

## 2022-10-22 MED ORDER — SODIUM CHLORIDE (PF) 0.9 % IJ SOLN
INTRAMUSCULAR | Status: AC
  Filled 2022-10-22: qty 50

## 2022-10-22 MED ORDER — IOHEXOL 350 MG/ML SOLN
100.0000 mL | Freq: Once | INTRAVENOUS | Status: AC | PRN
  Administered 2022-10-22: 100 mL via INTRAVENOUS

## 2023-02-04 DIAGNOSIS — L821 Other seborrheic keratosis: Secondary | ICD-10-CM | POA: Diagnosis not present

## 2023-02-04 DIAGNOSIS — Z85828 Personal history of other malignant neoplasm of skin: Secondary | ICD-10-CM | POA: Diagnosis not present

## 2023-02-04 DIAGNOSIS — L814 Other melanin hyperpigmentation: Secondary | ICD-10-CM | POA: Diagnosis not present

## 2023-02-04 DIAGNOSIS — L578 Other skin changes due to chronic exposure to nonionizing radiation: Secondary | ICD-10-CM | POA: Diagnosis not present

## 2023-02-04 DIAGNOSIS — D225 Melanocytic nevi of trunk: Secondary | ICD-10-CM | POA: Diagnosis not present

## 2023-02-04 DIAGNOSIS — L820 Inflamed seborrheic keratosis: Secondary | ICD-10-CM | POA: Diagnosis not present

## 2023-03-18 ENCOUNTER — Telehealth: Payer: Self-pay | Admitting: Physician Assistant

## 2023-03-18 NOTE — Telephone Encounter (Signed)
Patient wants to see if she could get the Cortizone shot in the hip. [phone]

## 2023-03-18 NOTE — Telephone Encounter (Signed)
Ok with you or do you think she would benefit from the intra-articular injections?

## 2023-03-18 NOTE — Telephone Encounter (Signed)
Scheduled with Jean Rosenthal

## 2023-03-18 NOTE — Telephone Encounter (Signed)
Called pt. She states she is in a lot of pain in hip and in bilateral wrist

## 2023-03-19 ENCOUNTER — Ambulatory Visit (INDEPENDENT_AMBULATORY_CARE_PROVIDER_SITE_OTHER): Payer: 59 | Admitting: Student

## 2023-03-19 ENCOUNTER — Encounter (HOSPITAL_BASED_OUTPATIENT_CLINIC_OR_DEPARTMENT_OTHER): Payer: Self-pay | Admitting: Student

## 2023-03-19 DIAGNOSIS — M25542 Pain in joints of left hand: Secondary | ICD-10-CM | POA: Diagnosis not present

## 2023-03-19 DIAGNOSIS — M654 Radial styloid tenosynovitis [de Quervain]: Secondary | ICD-10-CM

## 2023-03-19 MED ORDER — TRIAMCINOLONE ACETONIDE 40 MG/ML IJ SUSP
1.0000 mL | INTRAMUSCULAR | Status: AC | PRN
  Administered 2023-03-19: 1 mL

## 2023-03-19 MED ORDER — LIDOCAINE HCL 1 % IJ SOLN
1.0000 mL | INTRAMUSCULAR | Status: AC | PRN
  Administered 2023-03-19: 1 mL

## 2023-03-19 NOTE — Progress Notes (Signed)
Chief Complaint: Bilateral wrist pain     History of Present Illness:    Carol Newman is a 62 y.o. female that presents today with pain in bilateral wrists.  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  This has involved a lot of lifting and pulling.  Pain is located on the thumb side of both wrists and is worse left than right.  Pain in the left wrist is moderate in severity and she has noticed decreased in grip strength and function.  Denies any numbness or tingling.  She has been icing occasionally, but denies any other treatments.  She has been seen by Rexene Edison, PA-C who has recommended an ultrasound guided injection of the right hip, however this has been feeling good the last few days and she would like to hold off on this for little while longer.   Surgical History:   None  PMH/PSH/Family History/Social History/Meds/Allergies:    Past Medical History:  Diagnosis Date   Diverticulitis    Past Surgical History:  Procedure Laterality Date   BREAST EXCISIONAL BIOPSY Left    Social History   Socioeconomic History   Marital status: Married    Spouse name: Not on file   Number of children: Not on file   Years of education: Not on file   Highest education level: Not on file  Occupational History   Not on file  Tobacco Use   Smoking status: Never   Smokeless tobacco: Never  Substance and Sexual Activity   Alcohol use: Never   Drug use: Never   Sexual activity: Not on file  Other Topics Concern   Not on file  Social History Narrative   Not on file   Social Determinants of Health   Financial Resource Strain: Not on file  Food Insecurity: Not on file  Transportation Needs: Not on file  Physical Activity: Not on file  Stress: Not on file  Social Connections: Unknown (11/14/2021)   Received from [facility]   Social Network    Social Network: Not  on file   History reviewed. No pertinent family history. Allergies  Allergen Reactions   Xylocaine-Epinephrine [Lidocaine-Epinephrine] Other (See Comments)    Had to go to ED post-dental visit for HR over 200.   Omnipaque [Iohexol] Hives    Hives to head and chest   Advil [Ibuprofen] Other (See Comments)    Kicks liver limits up   Penicillins    Current Outpatient Medications  Medication Sig Dispense Refill   cholecalciferol (VITAMIN D3) 25 MCG (1000 UT) tablet Take 5,000 Units by mouth daily.     estradiol (ESTRACE) 0.5 MG tablet Take 0.5 mg by mouth daily.     estradiol (ESTRACE) 1 MG tablet Take 0.5 mg by mouth daily.     fluticasone (FLONASE) 50 MCG/ACT nasal spray Place 2 sprays into both nostrils every other day.     HYDROcodone-acetaminophen (NORCO) 7.5-325 MG tablet Take 1 tablet by mouth 2 (two) times daily.     metroNIDAZOLE (METROCREAM) 0.75 % cream Apply topically 2 (two) times daily.     No current facility-administered medications for this visit.   No results found.  Review of Systems:   A ROS was performed including  pertinent positives and negatives as documented in the HPI.  Physical Exam :   Constitutional: NAD and appears stated age Neurological: Alert and oriented Psych: Appropriate affect and cooperative There were no vitals taken for this visit.   Comprehensive Musculoskeletal Exam:    Tenderness palpation over bilateral radial styloids extending to the first [facility] joint.  Positive Finkelstein test bilaterally.  Grip strength reduced in both hands, left slightly worse than right.  Normal wrist ROM with flexion extension bilaterally.  Radial pulses 2+.  Neurosensory exam intact.  Imaging:     Assessment:   62 y.o. female with bilateral wrist pain consistent with de Quervain's tenosynovitis.  This has been brought on by recent activities providing care for her mother.  She does have a history of stomach issues and her gastroenterologist has recommended  limited use of NSAIDs.  Given this, I have offered a cortisone injection of the first dorsal compartment.  Patient would like to proceed with this of the left wrist, but will hold off today on injection of the right.  1 cc of lidocaine and 1 cc of methylprednisolone was injected into the left first dorsal compartment without any complication she tolerated this well.  I have recommended use of a de Quervain's brace.  Will plan to assess relief with this injection, and she can follow-up as needed for potential ultrasound-guided injection of the right hip and/or assessment/injection of the right wrist.  Plan :    -Cortisone injection performed today of the left first dorsal compartment after patient consent -Return to clinic as needed     Procedure Note  Patient: Carol Newman             Date of Birth: Feb 06, 1961           MRN: [id number]             Visit Date: 03/19/2023  Procedures: Visit Diagnoses:  1. De Quervain's tenosynovitis, bilateral     Hand/UE Inj: L extensor compartment 1 for de Quervain's tenosynovitis on 03/19/2023 11:00 AM Indications: pain Details: 25 G needle, radial approach Medications: 1 mL lidocaine 1 %; 1 mL triamcinolone acetonide 40 MG/ML Outcome: tolerated well, no immediate complications Procedure, treatment alternatives, risks and benefits explained, specific risks discussed. Consent was given by the patient. Immediately prior to procedure a time out was called to verify the correct patient, procedure, equipment, support staff and site/side marked as required. Patient was prepped and draped in the usual sterile fashion.      I personally saw and evaluated the patient, and participated in the management and treatment plan.  Carol Nordmann, PA-C Orthopedics

## 2023-03-26 ENCOUNTER — Ambulatory Visit (HOSPITAL_BASED_OUTPATIENT_CLINIC_OR_DEPARTMENT_OTHER): Payer: No Typology Code available for payment source | Admitting: Student

## 2023-04-04 ENCOUNTER — Ambulatory Visit: Payer: No Typology Code available for payment source | Admitting: Physician Assistant

## 2023-04-15 ENCOUNTER — Ambulatory Visit: Payer: 59 | Admitting: Physician Assistant

## 2023-04-15 ENCOUNTER — Encounter: Payer: Self-pay | Admitting: Physician Assistant

## 2023-04-15 ENCOUNTER — Other Ambulatory Visit: Payer: Self-pay | Admitting: Internal Medicine

## 2023-04-15 DIAGNOSIS — Z1231 Encounter for screening mammogram for malignant neoplasm of breast: Secondary | ICD-10-CM

## 2023-04-15 DIAGNOSIS — M7061 Trochanteric bursitis, right hip: Secondary | ICD-10-CM | POA: Diagnosis not present

## 2023-04-15 MED ORDER — LIDOCAINE HCL 1 % IJ SOLN
3.0000 mL | INTRAMUSCULAR | Status: AC | PRN
  Administered 2023-04-15: 3 mL

## 2023-04-15 MED ORDER — METHYLPREDNISOLONE ACETATE 40 MG/ML IJ SUSP
40.0000 mg | INTRAMUSCULAR | Status: AC | PRN
  Administered 2023-04-15: 40 mg via INTRA_ARTICULAR

## 2023-04-15 NOTE — Progress Notes (Signed)
Procedure Note  Patient: Carol Newman             Date of Birth: Feb 05, 1961           MRN: [id number]             Visit Date: 04/15/2023   HPI: Carol Newman comes in today due to right hip pain.  She has had no known injury.  Pain is lateral aspect of the hip no radicular symptoms down the leg.  She is asking for right hip injection.  Denies any fevers chills.  Review of systems: See HPI otherwise negative or noncontributory  Physical exam: General Well-developed well-nourished female who ambulates without any assistive device.  Mood and affect appropriate Right hip: Good range of motion without pain.  Tenderness over the right trochanteric region.  No rashes skin lesions ulcerations over the right hip. Procedures: Visit Diagnoses:  1. Trochanteric bursitis, right hip     Large Joint Inj: R greater trochanter on 04/15/2023 8:38 AM Details: 22 G 1.5 in needle, lateral approach  Arthrogram: No  Medications: 3 mL lidocaine 1 %; 40 mg methylPREDNISolone acetate 40 MG/ML Outcome: tolerated well, no immediate complications Procedure, treatment alternatives, risks and benefits explained, specific risks discussed. Consent was given by the patient. Immediately prior to procedure a time out was called to verify the correct patient, procedure, equipment, support staff and site/side marked as required. Patient was prepped and draped in the usual sterile fashion.     Plan: She will follow-up as needed.  She denies with at least 3 months between injections.  Questions encouraged and answered

## 2023-05-24 DIAGNOSIS — R109 Unspecified abdominal pain: Secondary | ICD-10-CM | POA: Diagnosis not present

## 2023-05-27 ENCOUNTER — Ambulatory Visit
Admission: RE | Admit: 2023-05-27 | Discharge: 2023-05-27 | Disposition: A | Discharge status: Home / Self Care | Payer: 59 | Source: Ambulatory Visit | Attending: Internal Medicine | Admitting: Internal Medicine

## 2023-05-27 ENCOUNTER — Other Ambulatory Visit: Payer: Self-pay | Admitting: Internal Medicine

## 2023-05-27 DIAGNOSIS — R109 Unspecified abdominal pain: Secondary | ICD-10-CM

## 2023-05-27 DIAGNOSIS — R3129 Other microscopic hematuria: Secondary | ICD-10-CM | POA: Diagnosis not present

## 2023-06-11 DIAGNOSIS — R300 Dysuria: Secondary | ICD-10-CM | POA: Diagnosis not present

## 2023-06-19 ENCOUNTER — Ambulatory Visit
Admission: RE | Admit: 2023-06-19 | Discharge: 2023-06-19 | Disposition: A | Discharge status: Home / Self Care | Payer: 59 | Source: Ambulatory Visit | Attending: Internal Medicine | Admitting: Internal Medicine

## 2023-06-19 DIAGNOSIS — Z1231 Encounter for screening mammogram for malignant neoplasm of breast: Secondary | ICD-10-CM

## 2023-06-28 DIAGNOSIS — Z79899 Other long term (current) drug therapy: Secondary | ICD-10-CM | POA: Diagnosis not present

## 2023-06-28 DIAGNOSIS — E7800 Pure hypercholesterolemia, unspecified: Secondary | ICD-10-CM | POA: Diagnosis not present

## 2023-06-28 DIAGNOSIS — E559 Vitamin D deficiency, unspecified: Secondary | ICD-10-CM | POA: Diagnosis not present

## 2023-07-04 ENCOUNTER — Other Ambulatory Visit (HOSPITAL_COMMUNITY): Payer: Self-pay | Admitting: Internal Medicine

## 2023-07-04 DIAGNOSIS — E7800 Pure hypercholesterolemia, unspecified: Secondary | ICD-10-CM

## 2023-07-18 ENCOUNTER — Ambulatory Visit (HOSPITAL_COMMUNITY)
Admission: RE | Admit: 2023-07-18 | Discharge: 2023-07-18 | Disposition: A | Discharge status: Home / Self Care | Payer: Self-pay | Source: Ambulatory Visit | Attending: Internal Medicine | Admitting: Internal Medicine

## 2023-07-18 DIAGNOSIS — E7800 Pure hypercholesterolemia, unspecified: Secondary | ICD-10-CM | POA: Insufficient documentation

## 2023-09-16 DIAGNOSIS — J00 Acute nasopharyngitis [common cold]: Secondary | ICD-10-CM | POA: Diagnosis not present

## 2023-09-16 DIAGNOSIS — Z03818 Encounter for observation for suspected exposure to other biological agents ruled out: Secondary | ICD-10-CM | POA: Diagnosis not present

## 2023-09-16 DIAGNOSIS — J0101 Acute recurrent maxillary sinusitis: Secondary | ICD-10-CM | POA: Diagnosis not present

## 2023-11-08 DIAGNOSIS — M797 Fibromyalgia: Secondary | ICD-10-CM | POA: Diagnosis not present

## 2023-11-08 DIAGNOSIS — G894 Chronic pain syndrome: Secondary | ICD-10-CM | POA: Diagnosis not present

## 2023-12-19 ENCOUNTER — Ambulatory Visit (HOSPITAL_COMMUNITY)
Admission: RE | Admit: 2023-12-19 | Discharge: 2023-12-19 | Disposition: A | Discharge status: Home / Self Care | Source: Ambulatory Visit | Attending: Vascular Surgery | Admitting: Vascular Surgery

## 2023-12-19 ENCOUNTER — Other Ambulatory Visit (HOSPITAL_COMMUNITY): Payer: Self-pay | Admitting: Internal Medicine

## 2023-12-19 DIAGNOSIS — R7989 Other specified abnormal findings of blood chemistry: Secondary | ICD-10-CM

## 2023-12-19 DIAGNOSIS — M79605 Pain in left leg: Secondary | ICD-10-CM

## 2023-12-19 DIAGNOSIS — M79662 Pain in left lower leg: Secondary | ICD-10-CM | POA: Diagnosis not present

## 2024-01-17 DIAGNOSIS — E7800 Pure hypercholesterolemia, unspecified: Secondary | ICD-10-CM | POA: Diagnosis not present

## 2024-01-17 DIAGNOSIS — K529 Noninfective gastroenteritis and colitis, unspecified: Secondary | ICD-10-CM | POA: Diagnosis not present

## 2024-01-17 DIAGNOSIS — E2839 Other primary ovarian failure: Secondary | ICD-10-CM | POA: Diagnosis not present

## 2024-01-17 DIAGNOSIS — E559 Vitamin D deficiency, unspecified: Secondary | ICD-10-CM | POA: Diagnosis not present

## 2024-01-17 DIAGNOSIS — F419 Anxiety disorder, unspecified: Secondary | ICD-10-CM | POA: Diagnosis not present

## 2024-01-17 DIAGNOSIS — M797 Fibromyalgia: Secondary | ICD-10-CM | POA: Diagnosis not present

## 2024-02-19 DIAGNOSIS — L814 Other melanin hyperpigmentation: Secondary | ICD-10-CM | POA: Diagnosis not present

## 2024-02-19 DIAGNOSIS — Z85828 Personal history of other malignant neoplasm of skin: Secondary | ICD-10-CM | POA: Diagnosis not present

## 2024-02-19 DIAGNOSIS — L821 Other seborrheic keratosis: Secondary | ICD-10-CM | POA: Diagnosis not present

## 2024-02-19 DIAGNOSIS — L719 Rosacea, unspecified: Secondary | ICD-10-CM | POA: Diagnosis not present

## 2024-02-19 DIAGNOSIS — L578 Other skin changes due to chronic exposure to nonionizing radiation: Secondary | ICD-10-CM | POA: Diagnosis not present

## 2024-02-19 DIAGNOSIS — D225 Melanocytic nevi of trunk: Secondary | ICD-10-CM | POA: Diagnosis not present

## 2024-02-27 DIAGNOSIS — M25552 Pain in left hip: Secondary | ICD-10-CM | POA: Diagnosis not present

## 2024-02-27 DIAGNOSIS — M25551 Pain in right hip: Secondary | ICD-10-CM | POA: Diagnosis not present

## 2024-02-27 DIAGNOSIS — M5459 Other low back pain: Secondary | ICD-10-CM | POA: Diagnosis not present

## 2024-04-07 ENCOUNTER — Other Ambulatory Visit (HOSPITAL_BASED_OUTPATIENT_CLINIC_OR_DEPARTMENT_OTHER): Payer: Self-pay

## 2024-04-07 ENCOUNTER — Encounter (HOSPITAL_BASED_OUTPATIENT_CLINIC_OR_DEPARTMENT_OTHER): Payer: Self-pay | Admitting: Student

## 2024-04-07 ENCOUNTER — Ambulatory Visit (HOSPITAL_BASED_OUTPATIENT_CLINIC_OR_DEPARTMENT_OTHER): Admitting: Student

## 2024-04-07 ENCOUNTER — Ambulatory Visit (HOSPITAL_BASED_OUTPATIENT_CLINIC_OR_DEPARTMENT_OTHER)

## 2024-04-07 DIAGNOSIS — M7061 Trochanteric bursitis, right hip: Secondary | ICD-10-CM | POA: Diagnosis not present

## 2024-04-07 DIAGNOSIS — M25551 Pain in right hip: Secondary | ICD-10-CM

## 2024-04-07 DIAGNOSIS — M1611 Unilateral primary osteoarthritis, right hip: Secondary | ICD-10-CM | POA: Diagnosis not present

## 2024-04-07 MED ORDER — LIDOCAINE HCL 1 % IJ SOLN
4.0000 mL | INTRAMUSCULAR | Status: AC | PRN
  Administered 2024-04-07: 4 mL

## 2024-04-07 MED ORDER — TRIAMCINOLONE ACETONIDE 40 MG/ML IJ SUSP
2.0000 mL | INTRAMUSCULAR | Status: AC | PRN
  Administered 2024-04-07: 2 mL via INTRA_ARTICULAR

## 2024-04-07 NOTE — Progress Notes (Signed)
 Chief Complaint: Right hip pain    Discussed the use of AI scribe software for clinical note transcription with the patient, who gave verbal consent to proceed.  History of Present Illness Carol Newman is a 63 year old female who presents with right hip pain. She experiences hip pain on the lateral aspect, radiating into the groin, exacerbated by certain movements like turning her leg. Discomfort is significant when lying on the affected side, which is her preferred sleeping position. A steroid injection in her buttock on February 28, 2024, provided relief for about a week. She has not done well with oral steroids in the past due to side effects such as insomnia and swelling.  She last received a trochanteric injection on 04/15/2023 with Tory Gaskins which did give her some good relief.  Denies any significant low back pain.   Surgical History:   None  PMH/PSH/Family History/Social History/Meds/Allergies:    Past Medical History:  Diagnosis Date   Diverticulitis    Past Surgical History:  Procedure Laterality Date   BREAST EXCISIONAL BIOPSY Left    Social History   Socioeconomic History   Marital status: Married    Spouse name: Not on file   Number of children: Not on file   Years of education: Not on file   Highest education level: Not on file  Occupational History   Not on file  Tobacco Use   Smoking status: Never   Smokeless tobacco: Never  Substance and Sexual Activity   Alcohol use: Never   Drug use: Never   Sexual activity: Not on file  Other Topics Concern   Not on file  Social History Narrative   Not on file   Social Drivers of Health   Financial Resource Strain: Not on file  Food Insecurity: Not on file  Transportation Needs: Not on file  Physical Activity: Not on file  Stress: Not on file  Social Connections: Unknown (11/14/2021)   Received from [facility]   Social Network    Social Network: Not on file   History  reviewed. No pertinent family history. Allergies  Allergen Reactions   Xylocaine -Epinephrine [Lidocaine -Epinephrine (Pf)] Other (See Comments)    Had to go to ED post-dental visit for HR over 200.   Omnipaque  [Iohexol ] Hives    Hives to head and chest   Advil [Ibuprofen] Other (See Comments)    Kicks liver limits up   Penicillins    Current Outpatient Medications  Medication Sig Dispense Refill   cholecalciferol (VITAMIN D3) 25 MCG (1000 UT) tablet Take 5,000 Units by mouth daily.     estradiol (ESTRACE) 0.5 MG tablet Take 0.5 mg by mouth daily.     estradiol (ESTRACE) 1 MG tablet Take 0.5 mg by mouth daily.     fluticasone (FLONASE) 50 MCG/ACT nasal spray Place 2 sprays into both nostrils every other day.     HYDROcodone-acetaminophen (NORCO) 7.5-325 MG tablet Take 1 tablet by mouth 2 (two) times daily.     metroNIDAZOLE (METROCREAM) 0.75 % cream Apply topically 2 (two) times daily.     No current facility-administered medications for this visit.   No results found.  Review of Systems:   A ROS was performed including pertinent positives and negatives as documented in the HPI.  Physical Exam :   Constitutional: NAD  and appears stated age Neurological: Alert and oriented Psych: Appropriate affect and cooperative There were no vitals taken for this visit.   Comprehensive Musculoskeletal Exam:    Tenderness to palpation over the right greater trochanter without overlying erythema warmth.  Fluid passive right hip range of motion 120 degrees flexion 30 degrees of internal and external rotation.  Distal neurosensory exam is intact.  Imaging:   Xray (AP pelvis, right hip 3 views): Mild degenerative changes of bilateral hips but negative for acute abnormality   I personally reviewed and interpreted the radiographs.      Assessment & Plan Right hip trochanteric bursitis and/or gluteal tendinopathy   Chronic right hip pain is primarily lateral, radiating into the groin. A  previous Depo-Medrol  injection provided relief for about one week. Pain worsens with certain movements and when lying on the affected side.  Current symptoms suggest trochanteric pain, with bursitis and gluteal tendinopathy in the differential.  She does not have any significant arthritis within the hip joint and her pain is primarily lateral.  Patient is agreeable to an ultrasound guided injection of the greater trochanter.  She tolerated this well and did express some relief prior to clinic departure.  Can follow-up as needed.      Procedure Note  Patient: Carol Newman             Date of Birth: 1960-11-21           MRN: [id number]             Visit Date: 04/07/2024  Procedures: Visit Diagnoses:  1. Trochanteric bursitis, right hip   2. Pain of right hip     Large Joint Inj: R greater trochanter on 04/07/2024 12:21 PM Indications: pain Details: 22 G 3.5 in needle, ultrasound-guided lateral approach Medications: 4 mL lidocaine  1 %; 2 mL triamcinolone  acetonide 40 MG/ML Outcome: tolerated well, no immediate complications Procedure, treatment alternatives, risks and benefits explained, specific risks discussed. Consent was given by the patient. Immediately prior to procedure a time out was called to verify the correct patient, procedure, equipment, support staff and site/side marked as required. Patient was prepped and draped in the usual sterile fashion.        I personally saw and evaluated the patient, and participated in the management and treatment plan.  Leonce Reveal, PA-C Orthopedics

## 2024-04-24 DIAGNOSIS — R399 Unspecified symptoms and signs involving the genitourinary system: Secondary | ICD-10-CM | POA: Diagnosis not present

## 2024-04-24 DIAGNOSIS — E2839 Other primary ovarian failure: Secondary | ICD-10-CM | POA: Diagnosis not present

## 2024-05-18 DIAGNOSIS — R10A1 Flank pain, right side: Secondary | ICD-10-CM | POA: Diagnosis not present

## 2024-05-19 ENCOUNTER — Ambulatory Visit
Admission: RE | Admit: 2024-05-19 | Discharge: 2024-05-19 | Disposition: A | Discharge status: Home / Self Care | Source: Ambulatory Visit | Attending: Internal Medicine | Admitting: Internal Medicine

## 2024-05-19 ENCOUNTER — Other Ambulatory Visit: Payer: Self-pay | Admitting: Internal Medicine

## 2024-05-19 DIAGNOSIS — R10A1 Flank pain, right side: Secondary | ICD-10-CM

## 2024-05-19 DIAGNOSIS — Z87442 Personal history of urinary calculi: Secondary | ICD-10-CM

## 2024-06-18 DIAGNOSIS — H25093 Other age-related incipient cataract, bilateral: Secondary | ICD-10-CM | POA: Diagnosis not present

## 2024-07-29 ENCOUNTER — Other Ambulatory Visit: Payer: Self-pay | Admitting: Internal Medicine

## 2024-07-29 DIAGNOSIS — Z1231 Encounter for screening mammogram for malignant neoplasm of breast: Secondary | ICD-10-CM

## 2024-07-30 ENCOUNTER — Ambulatory Visit (HOSPITAL_BASED_OUTPATIENT_CLINIC_OR_DEPARTMENT_OTHER): Admitting: Student

## 2024-07-30 ENCOUNTER — Ambulatory Visit (HOSPITAL_BASED_OUTPATIENT_CLINIC_OR_DEPARTMENT_OTHER)

## 2024-07-30 DIAGNOSIS — M654 Radial styloid tenosynovitis [de Quervain]: Secondary | ICD-10-CM

## 2024-07-30 DIAGNOSIS — M25532 Pain in left wrist: Secondary | ICD-10-CM | POA: Diagnosis not present

## 2024-07-30 MED ORDER — TRIAMCINOLONE ACETONIDE 40 MG/ML IJ SUSP
2.0000 mL | INTRAMUSCULAR | Status: AC | PRN
  Administered 2024-07-30: 2 mL

## 2024-07-30 MED ORDER — LIDOCAINE HCL 1 % IJ SOLN
2.0000 mL | INTRAMUSCULAR | Status: AC | PRN
  Administered 2024-07-30: 2 mL

## 2024-07-30 NOTE — Progress Notes (Signed)
 "                                Chief Complaint: Left wrist pain     History of Present Illness:    Carol Newman is a 64 y.o. female who presents today for evaluation of left wrist pain.  Patient was seen in clinic on 03/19/2023 for bilateral wrist pain and examination at that time was very consistent with de Quervain's tenosynovitis.  She received a left wrist first dorsal compartment injection at that time and states that her symptoms resolved until recently.  Pain is currently only present in the left wrist on the thumb side.  She has been utilizing a brace with only mild relief.  Surgical History:   None  PMH/PSH/Family History/Social History/Meds/Allergies:    Past Medical History:  Diagnosis Date   Diverticulitis    Past Surgical History:  Procedure Laterality Date   BREAST EXCISIONAL BIOPSY Left    Social History   Socioeconomic History   Marital status: Married    Spouse name: Not on file   Number of children: Not on file   Years of education: Not on file   Highest education level: Not on file  Occupational History   Not on file  Tobacco Use   Smoking status: Never   Smokeless tobacco: Never  Substance and Sexual Activity   Alcohol use: Never   Drug use: Never   Sexual activity: Not on file  Other Topics Concern   Not on file  Social History Narrative   Not on file   Social Drivers of Health   Tobacco Use: Medium Risk (06/18/2024)   Received from [facility]   Patient History    Smoking Tobacco Use: Former    Smokeless Tobacco Use: Never    Passive Exposure: Not on Actuary Strain: Not on file  Food Insecurity: Not on file  Transportation Needs: Not on file  Physical Activity: Not on file  Stress: Not on file  Social Connections: Not on file  Depression (EYV7-0): Not on file  Alcohol Screen: Not on file  Housing: Unknown (06/15/2024)   Received from [facility]   Epic    Unable to Pay for  Housing in the Last Year: Not on file    Number of Times Moved in the Last Year: Not on file    At any time in the past 12 months, were you homeless or living in a shelter (including now)?: No  Utilities: Not on file  Health Literacy: Not on file   No family history on file. Allergies  Allergen Reactions   Xylocaine -Epinephrine [Lidocaine -Epinephrine (Pf)] Other (See Comments)    Had to go to ED post-dental visit for HR over 200.   Omnipaque  [Iohexol ] Hives    Hives to head and chest   Advil [Ibuprofen] Other (See Comments)    Kicks liver limits up   Penicillins    Current Outpatient Medications  Medication Sig Dispense Refill   cholecalciferol (VITAMIN D3) 25 MCG (1000 UT) tablet Take 5,000 Units by mouth daily.     estradiol (ESTRACE) 0.5 MG tablet Take 0.5 mg by mouth daily.     estradiol (ESTRACE) 1 MG tablet Take 0.5 mg by mouth daily.     fluticasone (FLONASE) 50 MCG/ACT nasal spray Place 2 sprays into both nostrils every other day.     HYDROcodone-acetaminophen (NORCO) 7.5-325 MG  tablet Take 1 tablet by mouth 2 (two) times daily.     metroNIDAZOLE (METROCREAM) 0.75 % cream Apply topically 2 (two) times daily.     No current facility-administered medications for this visit.   No results found.  Review of Systems:   A ROS was performed including pertinent positives and negatives as documented in the HPI.  Physical Exam :   Constitutional: NAD and appears stated age Neurological: Alert and oriented Psych: Appropriate affect and cooperative There were no vitals taken for this visit.   Comprehensive Musculoskeletal Exam:    Tenderness in the radial left wrist over the first dorsal compartment most prominent just distal to the radial styloid.  Positive Finklestein's test.  Pain is also noted with particularly active abduction of the thumb.  She is able to form a composite fist.  Radial pulse 2+.  Imaging:     Assessment:   65 y.o. female presenting today with radial  sided left wrist pain consistent with de Quervain's tenosynovitis.  She does have history of this and underwent an injection of the first dorsal compartment in 2024 which did give her complete and long-lasting relief.  She has trialed brace usage during the current exacerbation without significant improvement.  Given how well she did with the last injection, I have offered to repeat this today.  Injection was performed of the first dorsal compartment and she tolerated this very well.  She can continue brace usage as needed and would have her follow-up if symptoms persist.  Plan :    -Cortisone injection performed today of the left first dorsal compartment after patient consent -Return to clinic as needed     Procedure Note  Patient: Carol Newman             Date of Birth: Aug 06, 1960           MRN: [id number]             Visit Date: 07/30/2024  Procedures: Visit Diagnoses:  1. De Quervain's tenosynovitis, left     Hand/UE Inj: L extensor compartment 1 for de Quervain's tenosynovitis on 07/30/2024 12:16 PM Indications: pain Details: 25 G needle, radial approach Medications: 2 mL lidocaine  1 %; 2 mL triamcinolone  acetonide 40 MG/ML Outcome: tolerated well, no immediate complications Procedure, treatment alternatives, risks and benefits explained, specific risks discussed. Consent was given by the patient. Immediately prior to procedure a time out was called to verify the correct patient, procedure, equipment, support staff and site/side marked as required. Patient was prepped and draped in the usual sterile fashion.      I personally saw and evaluated the patient, and participated in the management and treatment plan.  Leonce Reveal, PA-C Orthopedics "

## 2024-08-17 ENCOUNTER — Ambulatory Visit
